# Patient Record
Sex: Male | Born: 1960 | Race: White | Hispanic: No | Marital: Married | Smoking: Former smoker
Health system: Southern US, Community
[De-identification: ages and names within clinical notes are randomized; demographics above are authoritative.]

## PROBLEM LIST (undated history)

## (undated) DIAGNOSIS — M109 Gout, unspecified: Secondary | ICD-10-CM

## (undated) DIAGNOSIS — I10 Essential (primary) hypertension: Secondary | ICD-10-CM

## (undated) DIAGNOSIS — F319 Bipolar disorder, unspecified: Secondary | ICD-10-CM

## (undated) DIAGNOSIS — E785 Hyperlipidemia, unspecified: Secondary | ICD-10-CM

## (undated) HISTORY — PX: NASAL SINUS SURGERY: SHX719

---

## 1998-04-19 ENCOUNTER — Observation Stay (HOSPITAL_COMMUNITY): Admission: RE | Admit: 1998-04-19 | Discharge: 1998-04-21 | Payer: Self-pay | Admitting: Family Medicine

## 1998-04-19 ENCOUNTER — Encounter: Payer: Self-pay | Admitting: Family Medicine

## 2002-02-27 ENCOUNTER — Ambulatory Visit (HOSPITAL_COMMUNITY): Admission: RE | Admit: 2002-02-27 | Discharge: 2002-02-27 | Payer: Self-pay | Admitting: *Deleted

## 2005-10-17 ENCOUNTER — Ambulatory Visit (HOSPITAL_BASED_OUTPATIENT_CLINIC_OR_DEPARTMENT_OTHER): Admission: RE | Admit: 2005-10-17 | Discharge: 2005-10-17 | Payer: Self-pay | Admitting: Otolaryngology

## 2005-10-22 ENCOUNTER — Ambulatory Visit: Payer: Self-pay | Admitting: Internal Medicine

## 2005-12-01 ENCOUNTER — Observation Stay (HOSPITAL_COMMUNITY): Admission: RE | Admit: 2005-12-01 | Discharge: 2005-12-02 | Payer: Self-pay | Admitting: Otolaryngology

## 2011-03-20 ENCOUNTER — Emergency Department (INDEPENDENT_AMBULATORY_CARE_PROVIDER_SITE_OTHER): Payer: BC Managed Care – PPO

## 2011-03-20 ENCOUNTER — Emergency Department (HOSPITAL_BASED_OUTPATIENT_CLINIC_OR_DEPARTMENT_OTHER)
Admission: EM | Admit: 2011-03-20 | Discharge: 2011-03-20 | Disposition: A | Discharge status: Home / Self Care | Payer: BC Managed Care – PPO | Attending: Emergency Medicine | Admitting: Emergency Medicine

## 2011-03-20 ENCOUNTER — Encounter: Payer: Self-pay | Admitting: *Deleted

## 2011-03-20 DIAGNOSIS — R51 Headache: Secondary | ICD-10-CM

## 2011-03-20 DIAGNOSIS — R4182 Altered mental status, unspecified: Secondary | ICD-10-CM

## 2011-03-20 DIAGNOSIS — R412 Retrograde amnesia: Secondary | ICD-10-CM

## 2011-03-20 DIAGNOSIS — R413 Other amnesia: Secondary | ICD-10-CM

## 2011-03-20 HISTORY — DX: Hyperlipidemia, unspecified: E78.5

## 2011-03-20 HISTORY — DX: Essential (primary) hypertension: I10

## 2011-03-20 HISTORY — DX: Bipolar disorder, unspecified: F31.9

## 2011-03-20 LAB — BASIC METABOLIC PANEL
CO2: 30 mEq/L (ref 19–32)
Calcium: 9.7 mg/dL (ref 8.4–10.5)
GFR calc Af Amer: >90 mL/min (ref >90)
GFR calc non Af Amer: >90 mL/min (ref >90)
Sodium: 140 mEq/L (ref 135–145)

## 2011-03-20 LAB — CBC
MCH: 31 pg (ref 26.0–34.0)
MCHC: 36.6 g/dL — ABNORMAL HIGH (ref 30.0–36.0)
Platelets: 176 10*3/uL (ref 150–400)
RBC: 5.13 MIL/uL (ref 4.22–5.81)

## 2011-03-20 LAB — TROPONIN I: Troponin I: <0.3 ng/mL (ref <0.30)

## 2011-03-20 NOTE — ED Notes (Signed)
Pt states syncopal episode yesterday, c/o dizzy , h/a.

## 2011-03-20 NOTE — ED Provider Notes (Signed)
History    Scribed for [name], MD, the patient was seen in room MH04/MH04. This chart was scribed by Katha Cabal. This patient's care was started at 7:21 PM.   CSN: [id number] Arrival date & time: 03/20/2011  6:31 PM  Chief Complaint  Patient presents with  . Near Syncope    (Consider location/radiation/quality/duration/timing/severity/associated sxs/prior treatment) HPI   Andrew Glover is a 50 y.o. male who presents to the Emergency Department complaining [place].  Per Wife:  Patient told her that he felt sick and eyes rolled back and passed out.  Patient doesn't remember the events.  Recalls prior to episode eyes rolled to right, headache and remembers leaving neighbors house.  Denies EtOH use.  Sx have resolved.  Patient was able to work today.  Denies chest pain.  Patient was told by PCP that he was a borderline diabetic.  [facility].  Dr. Fulton Mole       PAST MEDICAL HISTORY:  Past Medical History  Diagnosis Date  . Hypertension   . Hyperlipidemia   . Bipolar 1 disorder     PAST SURGICAL HISTORY:  History reviewed. No pertinent past surgical history.  FAMILY HISTORY:  History reviewed. No pertinent family history.   SOCIAL HISTORY: History   Social History  . Marital Status: Married    Spouse Name: N/A    Number of Children: N/A  . Years of Education: N/A   Social History Main Topics  . Smoking status: Never Smoker   . Smokeless tobacco: None  . Alcohol Use: No  . Drug Use: No  . Sexually Active: No   Other Topics Concern  . None   Social History Narrative  . None    Review of Systems  All other systems reviewed and are negative.    Allergies  Codeine  Home Medications   Current Outpatient Rx  Name Route Sig Dispense Refill  . ARIPIPRAZOLE 5 MG PO TABS Oral Take 2.5 mg by mouth daily.      . ATENOLOL-CHLORTHALIDONE 50-25 MG PO TABS Oral Take 1 tablet by mouth daily.      .  ATORVASTATIN CALCIUM 40 MG PO TABS Oral Take 40 mg by mouth daily.        BP 145/77  Pulse 65  Temp 98.8 F (37.1 C)  Resp 18  Ht 5\' 6"  (1.676 m)  Wt 178 lb (80.74 kg)  BMI 28.73 kg/m2  SpO2 97%  Physical Exam  Nursing note and vitals reviewed. Constitutional: He is oriented to person, place, and time. He appears well-developed and well-nourished. No distress.  HENT:  Head: Normocephalic and atraumatic.  Eyes: Conjunctivae and EOM are normal.  Neck: Normal range of motion.  Cardiovascular: Normal rate, regular rhythm, normal heart sounds and intact distal pulses.   Pulmonary/Chest: Effort normal and breath sounds normal. No respiratory distress. He has no wheezes. He has no rales.  Abdominal: Soft. He exhibits no distension. There is no tenderness. There is no rebound and no guarding.  Musculoskeletal: Normal range of motion. He exhibits no edema.  Neurological: He is alert and oriented to person, place, and time. Coordination normal.       Face symmetric.     Skin: Skin is warm and dry.  Psychiatric: He has a normal mood and affect. His speech is normal. Judgment normal.    ED Course  Procedures (including critical care time)   OTHER DATA REVIEWED: Nursing notes, vital signs,  and past medical records reviewed.   DIAGNOSTIC STUDIES: Oxygen Saturation is 97% on room air, normal by my interpretation.     LABS / RADIOLOGY:   Labs Reviewed  CBC - Abnormal; Notable for the following:    MCHC 36.6 (*)    All other components within normal limits  BASIC METABOLIC PANEL - Abnormal; Notable for the following:    Potassium 3.2 (*)    All other components within normal limits  TROPONIN I     Results for orders placed during the hospital encounter of 03/20/11  CBC      Component Value Range   WBC 9.0  4.0 - 10.5 (K/uL)   RBC 5.13  4.22 - 5.81 (MIL/uL)   Hemoglobin 15.9  13.0 - 17.0 (g/dL)   HCT 04.5  40.9 - 81.1 (%)   MCV 84.6  78.0 - 100.0 (fL)   MCH 31.0  26.0 -  34.0 (pg)   MCHC 36.6 (*) 30.0 - 36.0 (g/dL)   RDW 91.4  78.2 - 95.6 (%)   Platelets 176  150 - 400 (K/uL)  BASIC METABOLIC PANEL      Component Value Range   Sodium 140  135 - 145 (mEq/L)   Potassium 3.2 (*) 3.5 - 5.1 (mEq/L)   Chloride 101  96 - 112 (mEq/L)   CO2 30  19 - 32 (mEq/L)   Glucose, Bld 89  70 - 99 (mg/dL)   BUN 22  6 - 23 (mg/dL)   Creatinine, Ser 2.13  0.50 - 1.35 (mg/dL)   Calcium 9.7  8.4 - 08.6 (mg/dL)   GFR calc non Af Amer >90  >90 (mL/min)   GFR calc Af Amer >90  >90 (mL/min)  TROPONIN I      Component Value Range   Troponin I <0.30  <0.30 (ng/mL)     Ct Head Wo Contrast  03/20/2011  *RADIOLOGY REPORT*  Clinical Data: Headache.  Memory loss for 1 day.  CT HEAD WITHOUT CONTRAST  Technique:  Contiguous axial images were obtained from the base of the skull through the vertex without contrast.  Comparison: None.  Findings: No intracranial hemorrhage.  No CT evidence of large acute infarct.  Small acute infarct cannot be excluded by CT.  No intracranial mass lesion detected on this unenhanced exam.  No hydrocephalus.  Skull is intact.  Visualized sinuses and mastoid air cells are clear.  IMPRESSION: No acute abnormality.  Please see above.  Original Report Authenticated By: Fuller Canada, M.D.      ED COURSE / COORDINATION OF CARE:  7:33 PM  Physical exam complete.  Will order labs and review EKG.   8:24 PM  Radiological impression of CT Head:  No acute abnormality.   8:39 PM  Remaining labs returned. 9:16 PM  Plan to discharge patient.    Orders Placed This Encounter  Procedures  . CT Head Wo Contrast  . CBC  . Basic metabolic panel  . Troponin I  . EKG 12-Lead      Date: 03/20/2011  Rate: 70  Rhythm: normal sinus rhythm  QRS Axis: normal  Intervals: normal  ST/T Wave abnormalities: normal  Conduction Disutrbances:none  Narrative Interpretation:   Old EKG Reviewed: none available      MDM   MDM: Is unclear what the etiology of the  patient's complaints yesterday as well as his amnesia ar without complaint e associated with.  Today he has a normal neurologic exam he is without complaints.  My  suspicion for stroke is low.  My suspicion for TIA is low.  It does not sound like a seizure as the patient was able to drive home successfully.  His labs and head CT are normal.  Instructed to follow up closely with his physician.  Instructed to return to the ER for any new or worsening symptoms  IMPRESSION: Diagnoses that have been ruled out:  Diagnoses that are still under consideration:  Final diagnoses:  Amnesia (retrograde)     MEDICATIONS GIVEN IN THE E.D.     DISCHARGE MEDICATIONS: New Prescriptions   No medications on file     I personally performed the services described in this documentation, which was scribed in my presence. The recorded information has been reviewed and considered.            [name], MD 03/20/11 2135

## 2012-02-17 IMAGING — CT CT HEAD W/O CM
1 series · 16 of 30 positions shown, 20 images · non-contrast
Comparison: None.

CLINICAL DATA: Headache.  Memory loss for 1 day.

CT HEAD WITHOUT CONTRAST
TECHNIQUE: Contiguous axial images were obtained from the base of
the skull through the vertex without contrast.

[Series 2: head 4.8 h37s · axial · 0.45mm/px · z∈[+1151,+1284]mm · 16 of 32 slices shown, 20 images]
[im 2/32  brain]
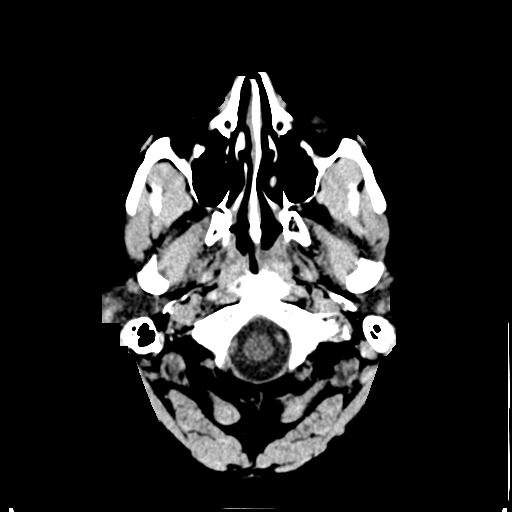
[im 2/32  bone]
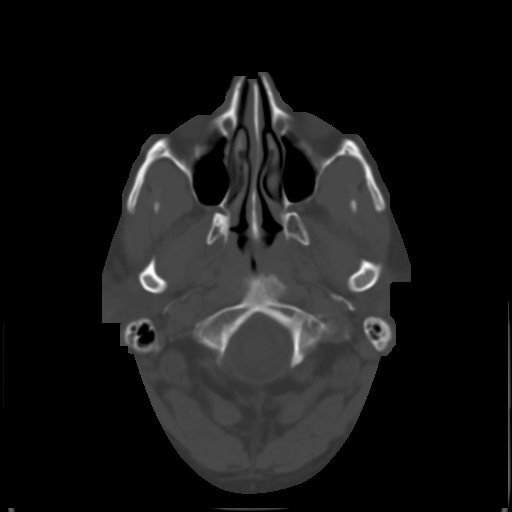
[im 4/32  brain]
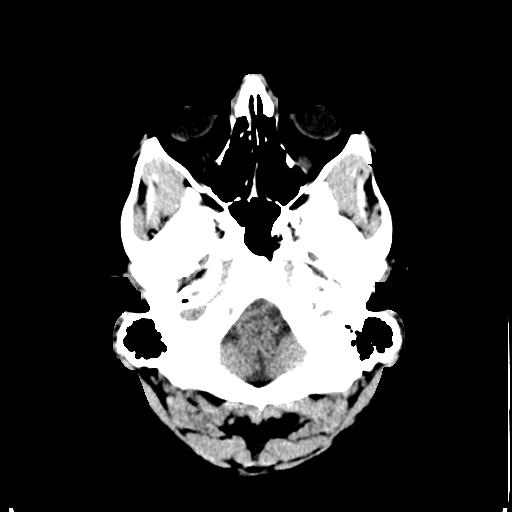
[im 6/32  brain]
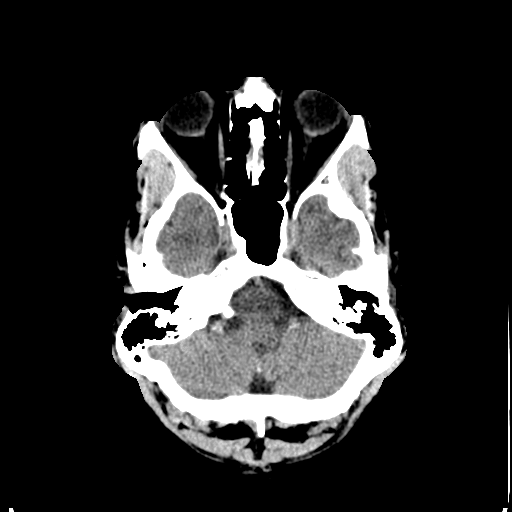
[im 8/32  brain]
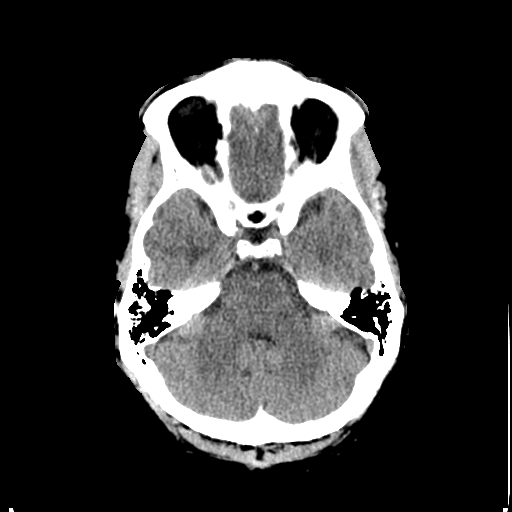
[im 9/32  brain]
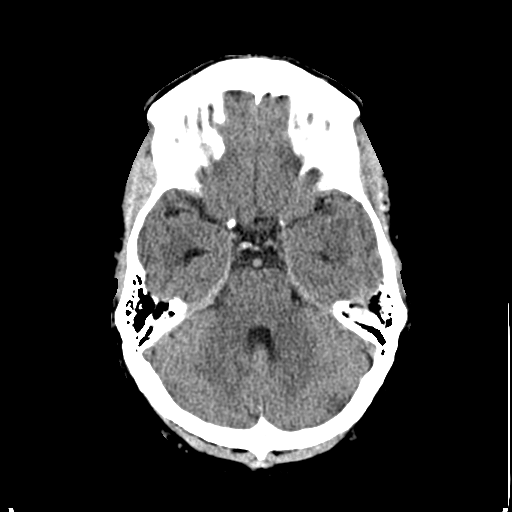
[im 9/32  bone]
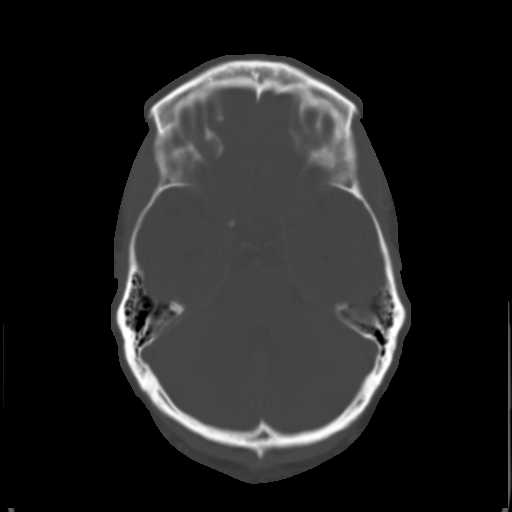
[im 11/32  brain]
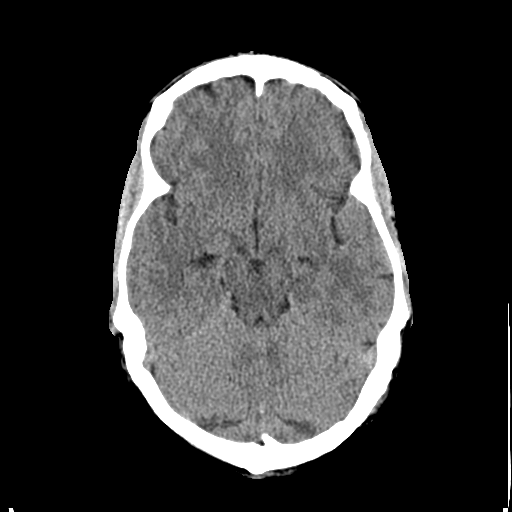
[im 13/32  brain]
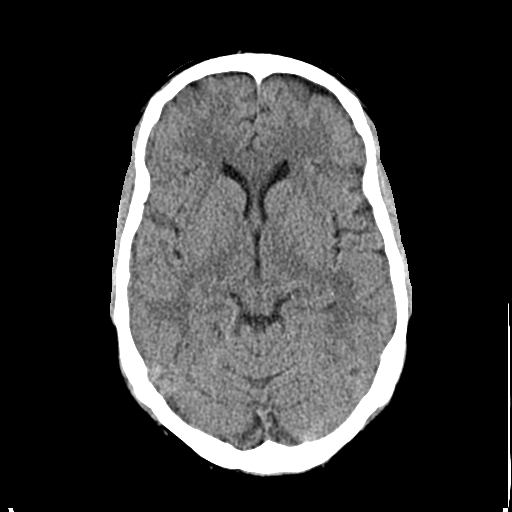
[im 15/32  brain]
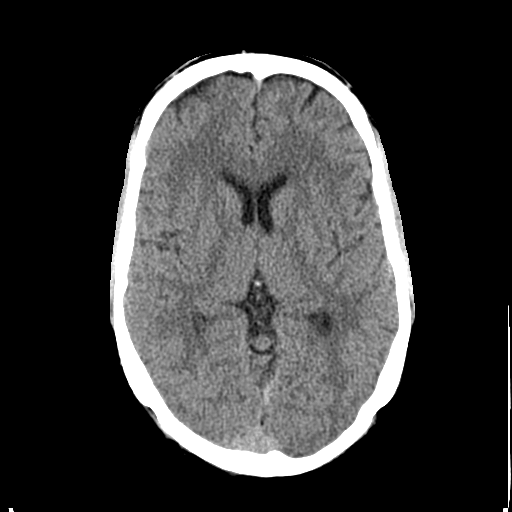
[im 17/32  brain]
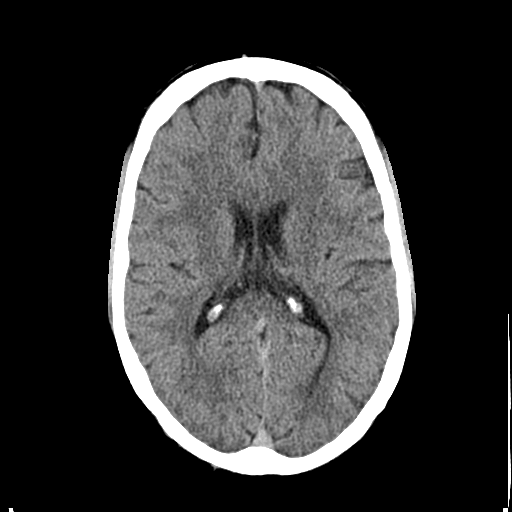
[im 17/32  bone]
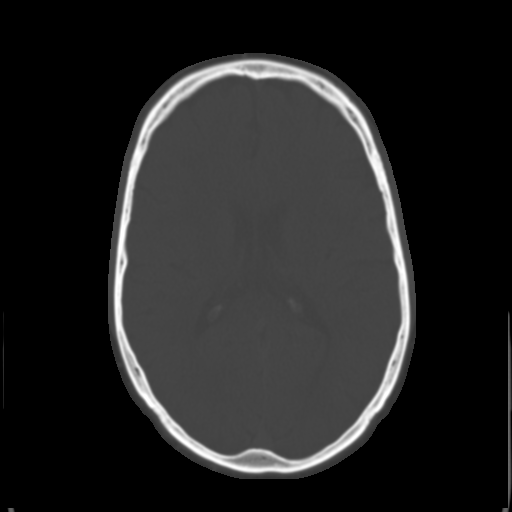
[im 19/32  brain]
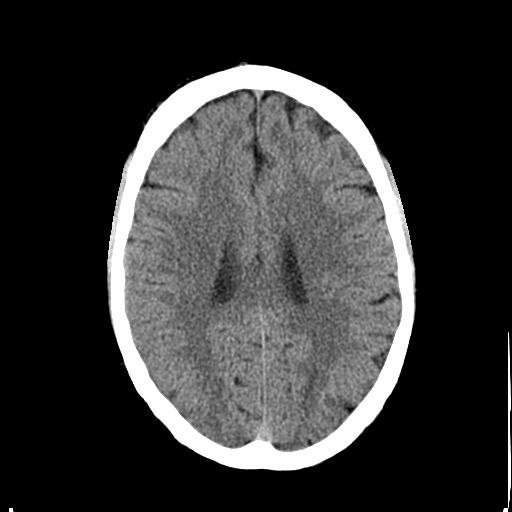
[im 21/32  brain]
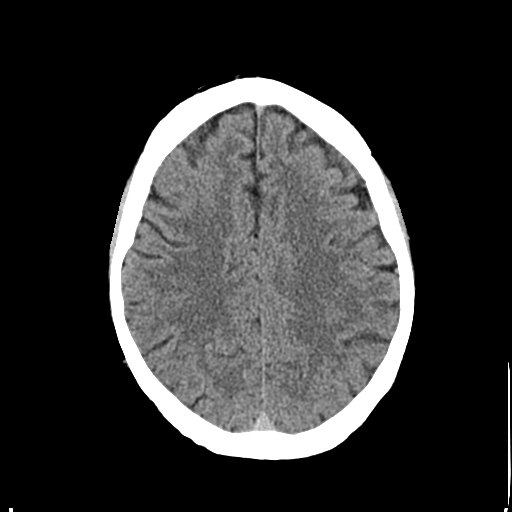
[im 23/32  brain]
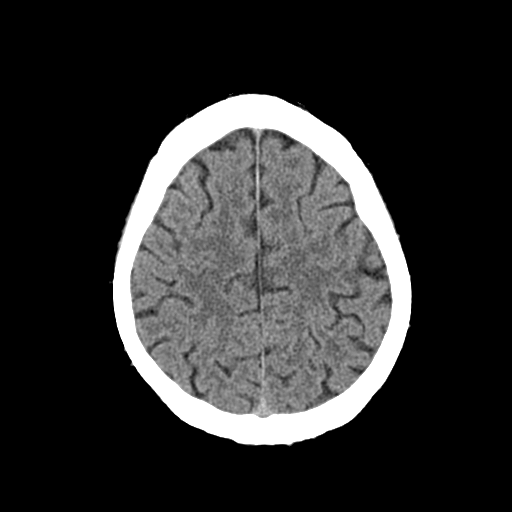
[im 24/32  brain]
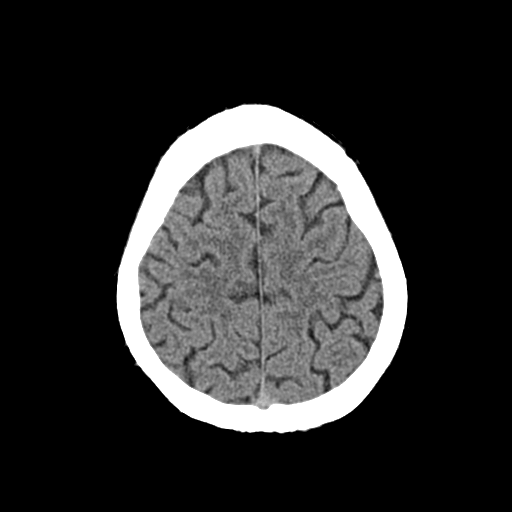
[im 24/32  bone]
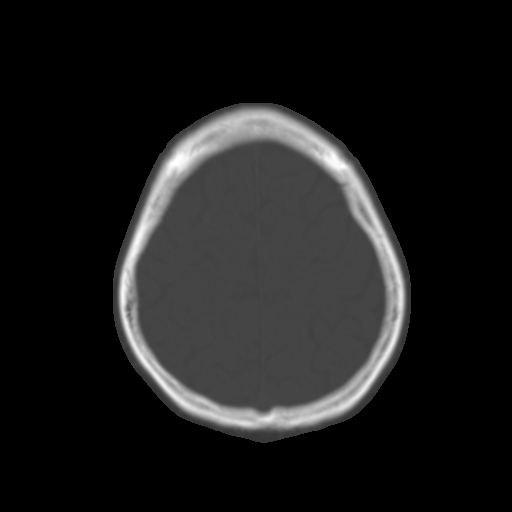
[im 26/32  brain]
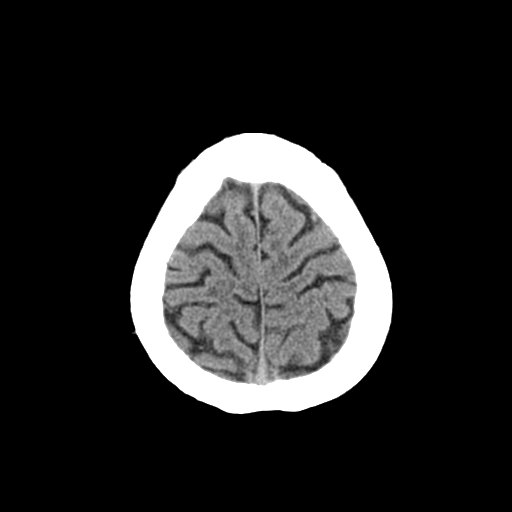
[im 28/32  brain]
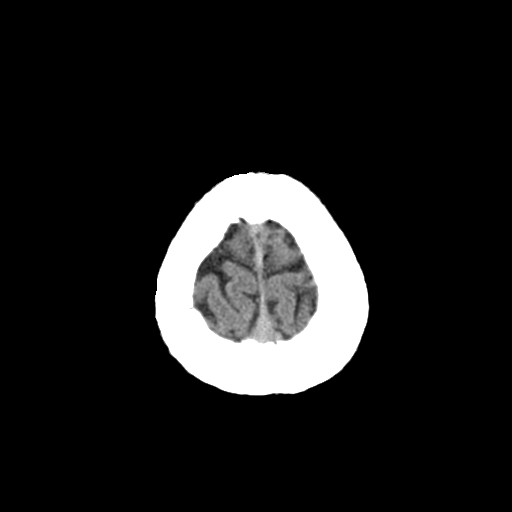
[im 30/32  brain]
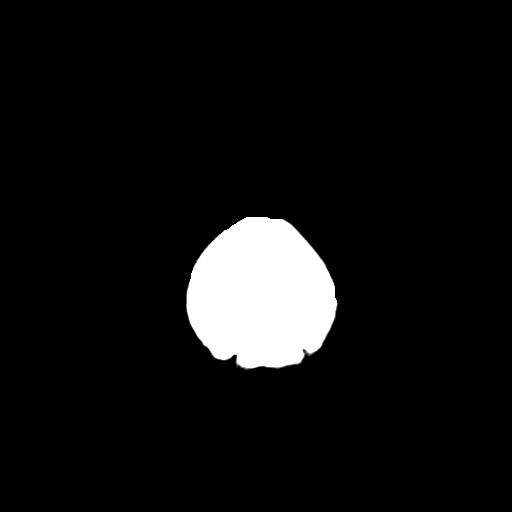

[16 of 30 positions shown; findings below may reference images not displayed]

FINDINGS: No intracranial hemorrhage.

No CT evidence of large acute infarct.  Small acute infarct cannot
be excluded by CT.

No intracranial mass lesion detected on this unenhanced exam.

No hydrocephalus.

Skull is intact.  Visualized sinuses and mastoid air cells are
clear.
IMPRESSION: No acute abnormality.  Please see above.

## 2015-09-29 DIAGNOSIS — Z8669 Personal history of other diseases of the nervous system and sense organs: Secondary | ICD-10-CM | POA: Diagnosis not present

## 2015-11-17 DIAGNOSIS — J069 Acute upper respiratory infection, unspecified: Secondary | ICD-10-CM | POA: Diagnosis not present

## 2016-01-25 DIAGNOSIS — L409 Psoriasis, unspecified: Secondary | ICD-10-CM | POA: Diagnosis not present

## 2016-01-25 DIAGNOSIS — E782 Mixed hyperlipidemia: Secondary | ICD-10-CM | POA: Diagnosis not present

## 2016-01-25 DIAGNOSIS — Z0000 Encounter for general adult medical examination without abnormal findings: Secondary | ICD-10-CM | POA: Diagnosis not present

## 2016-01-25 DIAGNOSIS — I10 Essential (primary) hypertension: Secondary | ICD-10-CM | POA: Diagnosis not present

## 2016-01-25 DIAGNOSIS — F3181 Bipolar II disorder: Secondary | ICD-10-CM | POA: Diagnosis not present

## 2016-01-25 DIAGNOSIS — Z125 Encounter for screening for malignant neoplasm of prostate: Secondary | ICD-10-CM | POA: Diagnosis not present

## 2016-07-13 DIAGNOSIS — F17200 Nicotine dependence, unspecified, uncomplicated: Secondary | ICD-10-CM | POA: Diagnosis not present

## 2016-07-13 DIAGNOSIS — F3181 Bipolar II disorder: Secondary | ICD-10-CM | POA: Diagnosis not present

## 2016-07-13 DIAGNOSIS — I10 Essential (primary) hypertension: Secondary | ICD-10-CM | POA: Diagnosis not present

## 2016-07-25 DIAGNOSIS — H2513 Age-related nuclear cataract, bilateral: Secondary | ICD-10-CM | POA: Diagnosis not present

## 2016-07-25 DIAGNOSIS — H40052 Ocular hypertension, left eye: Secondary | ICD-10-CM | POA: Diagnosis not present

## 2016-07-25 DIAGNOSIS — H25013 Cortical age-related cataract, bilateral: Secondary | ICD-10-CM | POA: Diagnosis not present

## 2016-07-25 DIAGNOSIS — H524 Presbyopia: Secondary | ICD-10-CM | POA: Diagnosis not present

## 2017-02-06 DIAGNOSIS — E782 Mixed hyperlipidemia: Secondary | ICD-10-CM | POA: Diagnosis not present

## 2017-02-06 DIAGNOSIS — Z1159 Encounter for screening for other viral diseases: Secondary | ICD-10-CM | POA: Diagnosis not present

## 2017-02-06 DIAGNOSIS — F3181 Bipolar II disorder: Secondary | ICD-10-CM | POA: Diagnosis not present

## 2017-02-06 DIAGNOSIS — L409 Psoriasis, unspecified: Secondary | ICD-10-CM | POA: Diagnosis not present

## 2017-02-06 DIAGNOSIS — I10 Essential (primary) hypertension: Secondary | ICD-10-CM | POA: Diagnosis not present

## 2017-02-06 DIAGNOSIS — Z0000 Encounter for general adult medical examination without abnormal findings: Secondary | ICD-10-CM | POA: Diagnosis not present

## 2017-08-16 DIAGNOSIS — R7303 Prediabetes: Secondary | ICD-10-CM | POA: Diagnosis not present

## 2017-08-16 DIAGNOSIS — F3181 Bipolar II disorder: Secondary | ICD-10-CM | POA: Diagnosis not present

## 2017-08-16 DIAGNOSIS — L409 Psoriasis, unspecified: Secondary | ICD-10-CM | POA: Diagnosis not present

## 2017-08-16 DIAGNOSIS — I10 Essential (primary) hypertension: Secondary | ICD-10-CM | POA: Diagnosis not present

## 2017-08-16 DIAGNOSIS — E782 Mixed hyperlipidemia: Secondary | ICD-10-CM | POA: Diagnosis not present

## 2017-11-20 DIAGNOSIS — H25013 Cortical age-related cataract, bilateral: Secondary | ICD-10-CM | POA: Diagnosis not present

## 2017-11-20 DIAGNOSIS — H2513 Age-related nuclear cataract, bilateral: Secondary | ICD-10-CM | POA: Diagnosis not present

## 2017-11-20 DIAGNOSIS — H524 Presbyopia: Secondary | ICD-10-CM | POA: Diagnosis not present

## 2018-02-19 DIAGNOSIS — E782 Mixed hyperlipidemia: Secondary | ICD-10-CM | POA: Diagnosis not present

## 2018-02-19 DIAGNOSIS — F3181 Bipolar II disorder: Secondary | ICD-10-CM | POA: Diagnosis not present

## 2018-02-19 DIAGNOSIS — I10 Essential (primary) hypertension: Secondary | ICD-10-CM | POA: Diagnosis not present

## 2018-02-19 DIAGNOSIS — R7303 Prediabetes: Secondary | ICD-10-CM | POA: Diagnosis not present

## 2018-02-19 DIAGNOSIS — L409 Psoriasis, unspecified: Secondary | ICD-10-CM | POA: Diagnosis not present

## 2018-03-07 DIAGNOSIS — E782 Mixed hyperlipidemia: Secondary | ICD-10-CM | POA: Diagnosis not present

## 2018-03-07 DIAGNOSIS — M2550 Pain in unspecified joint: Secondary | ICD-10-CM | POA: Diagnosis not present

## 2018-03-07 DIAGNOSIS — Z125 Encounter for screening for malignant neoplasm of prostate: Secondary | ICD-10-CM | POA: Diagnosis not present

## 2018-03-07 DIAGNOSIS — I10 Essential (primary) hypertension: Secondary | ICD-10-CM | POA: Diagnosis not present

## 2018-04-22 DIAGNOSIS — R0683 Snoring: Secondary | ICD-10-CM | POA: Diagnosis not present

## 2018-04-22 DIAGNOSIS — R0681 Apnea, not elsewhere classified: Secondary | ICD-10-CM | POA: Diagnosis not present

## 2018-05-20 DIAGNOSIS — G4733 Obstructive sleep apnea (adult) (pediatric): Secondary | ICD-10-CM | POA: Diagnosis not present

## 2018-06-04 DIAGNOSIS — G4733 Obstructive sleep apnea (adult) (pediatric): Secondary | ICD-10-CM | POA: Diagnosis not present

## 2018-07-05 DIAGNOSIS — G4733 Obstructive sleep apnea (adult) (pediatric): Secondary | ICD-10-CM | POA: Diagnosis not present

## 2018-07-26 DIAGNOSIS — G4733 Obstructive sleep apnea (adult) (pediatric): Secondary | ICD-10-CM | POA: Diagnosis not present

## 2018-08-05 DIAGNOSIS — G4733 Obstructive sleep apnea (adult) (pediatric): Secondary | ICD-10-CM | POA: Diagnosis not present

## 2018-08-19 DIAGNOSIS — R7303 Prediabetes: Secondary | ICD-10-CM | POA: Diagnosis not present

## 2018-08-19 DIAGNOSIS — G4733 Obstructive sleep apnea (adult) (pediatric): Secondary | ICD-10-CM | POA: Diagnosis not present

## 2018-08-19 DIAGNOSIS — E782 Mixed hyperlipidemia: Secondary | ICD-10-CM | POA: Diagnosis not present

## 2018-08-19 DIAGNOSIS — K219 Gastro-esophageal reflux disease without esophagitis: Secondary | ICD-10-CM | POA: Diagnosis not present

## 2018-08-19 DIAGNOSIS — I10 Essential (primary) hypertension: Secondary | ICD-10-CM | POA: Diagnosis not present

## 2018-09-03 DIAGNOSIS — G4733 Obstructive sleep apnea (adult) (pediatric): Secondary | ICD-10-CM | POA: Diagnosis not present

## 2018-10-04 DIAGNOSIS — G4733 Obstructive sleep apnea (adult) (pediatric): Secondary | ICD-10-CM | POA: Diagnosis not present

## 2018-11-03 DIAGNOSIS — G4733 Obstructive sleep apnea (adult) (pediatric): Secondary | ICD-10-CM | POA: Diagnosis not present

## 2018-11-26 DIAGNOSIS — H524 Presbyopia: Secondary | ICD-10-CM | POA: Diagnosis not present

## 2018-11-26 DIAGNOSIS — H2513 Age-related nuclear cataract, bilateral: Secondary | ICD-10-CM | POA: Diagnosis not present

## 2018-11-26 DIAGNOSIS — H25013 Cortical age-related cataract, bilateral: Secondary | ICD-10-CM | POA: Diagnosis not present

## 2018-12-04 DIAGNOSIS — G4733 Obstructive sleep apnea (adult) (pediatric): Secondary | ICD-10-CM | POA: Diagnosis not present

## 2019-01-03 DIAGNOSIS — G4733 Obstructive sleep apnea (adult) (pediatric): Secondary | ICD-10-CM | POA: Diagnosis not present

## 2019-01-23 DIAGNOSIS — R0902 Hypoxemia: Secondary | ICD-10-CM | POA: Diagnosis not present

## 2019-02-03 DIAGNOSIS — G4733 Obstructive sleep apnea (adult) (pediatric): Secondary | ICD-10-CM | POA: Diagnosis not present

## 2019-02-14 DIAGNOSIS — R112 Nausea with vomiting, unspecified: Secondary | ICD-10-CM | POA: Diagnosis not present

## 2019-02-14 DIAGNOSIS — R197 Diarrhea, unspecified: Secondary | ICD-10-CM | POA: Diagnosis not present

## 2019-02-14 DIAGNOSIS — Z20828 Contact with and (suspected) exposure to other viral communicable diseases: Secondary | ICD-10-CM | POA: Diagnosis not present

## 2019-03-04 DIAGNOSIS — M109 Gout, unspecified: Secondary | ICD-10-CM | POA: Diagnosis not present

## 2019-03-06 DIAGNOSIS — G4733 Obstructive sleep apnea (adult) (pediatric): Secondary | ICD-10-CM | POA: Diagnosis not present

## 2019-03-13 DIAGNOSIS — R7303 Prediabetes: Secondary | ICD-10-CM | POA: Diagnosis not present

## 2019-03-13 DIAGNOSIS — K219 Gastro-esophageal reflux disease without esophagitis: Secondary | ICD-10-CM | POA: Diagnosis not present

## 2019-03-13 DIAGNOSIS — Z0000 Encounter for general adult medical examination without abnormal findings: Secondary | ICD-10-CM | POA: Diagnosis not present

## 2019-03-13 DIAGNOSIS — E782 Mixed hyperlipidemia: Secondary | ICD-10-CM | POA: Diagnosis not present

## 2019-03-13 DIAGNOSIS — I10 Essential (primary) hypertension: Secondary | ICD-10-CM | POA: Diagnosis not present

## 2019-03-14 DIAGNOSIS — Z0000 Encounter for general adult medical examination without abnormal findings: Secondary | ICD-10-CM | POA: Diagnosis not present

## 2019-03-14 DIAGNOSIS — I10 Essential (primary) hypertension: Secondary | ICD-10-CM | POA: Diagnosis not present

## 2019-03-14 DIAGNOSIS — E782 Mixed hyperlipidemia: Secondary | ICD-10-CM | POA: Diagnosis not present

## 2019-04-01 DIAGNOSIS — D72829 Elevated white blood cell count, unspecified: Secondary | ICD-10-CM | POA: Diagnosis not present

## 2019-04-22 DIAGNOSIS — F3181 Bipolar II disorder: Secondary | ICD-10-CM | POA: Diagnosis not present

## 2019-04-23 DIAGNOSIS — R7303 Prediabetes: Secondary | ICD-10-CM | POA: Diagnosis not present

## 2019-05-05 DIAGNOSIS — M545 Low back pain: Secondary | ICD-10-CM | POA: Diagnosis not present

## 2019-05-27 DIAGNOSIS — Z01812 Encounter for preprocedural laboratory examination: Secondary | ICD-10-CM | POA: Diagnosis not present

## 2019-05-30 DIAGNOSIS — K633 Ulcer of intestine: Secondary | ICD-10-CM | POA: Diagnosis not present

## 2019-05-30 DIAGNOSIS — K5730 Diverticulosis of large intestine without perforation or abscess without bleeding: Secondary | ICD-10-CM | POA: Diagnosis not present

## 2019-05-30 DIAGNOSIS — Z86010 Personal history of colonic polyps: Secondary | ICD-10-CM | POA: Diagnosis not present

## 2019-05-30 DIAGNOSIS — K5289 Other specified noninfective gastroenteritis and colitis: Secondary | ICD-10-CM | POA: Diagnosis not present

## 2019-05-30 LAB — HM COLONOSCOPY

## 2019-06-10 DIAGNOSIS — F3181 Bipolar II disorder: Secondary | ICD-10-CM | POA: Diagnosis not present

## 2019-07-01 DIAGNOSIS — G4733 Obstructive sleep apnea (adult) (pediatric): Secondary | ICD-10-CM | POA: Diagnosis not present

## 2019-08-15 DIAGNOSIS — Z6833 Body mass index (BMI) 33.0-33.9, adult: Secondary | ICD-10-CM | POA: Diagnosis not present

## 2019-08-15 DIAGNOSIS — E7800 Pure hypercholesterolemia, unspecified: Secondary | ICD-10-CM | POA: Diagnosis not present

## 2019-08-15 DIAGNOSIS — I10 Essential (primary) hypertension: Secondary | ICD-10-CM | POA: Diagnosis not present

## 2019-08-15 DIAGNOSIS — E1169 Type 2 diabetes mellitus with other specified complication: Secondary | ICD-10-CM | POA: Diagnosis not present

## 2019-08-25 DIAGNOSIS — G4733 Obstructive sleep apnea (adult) (pediatric): Secondary | ICD-10-CM | POA: Diagnosis not present

## 2019-09-18 DIAGNOSIS — K219 Gastro-esophageal reflux disease without esophagitis: Secondary | ICD-10-CM | POA: Diagnosis not present

## 2019-09-18 DIAGNOSIS — I10 Essential (primary) hypertension: Secondary | ICD-10-CM | POA: Diagnosis not present

## 2019-09-18 DIAGNOSIS — E1169 Type 2 diabetes mellitus with other specified complication: Secondary | ICD-10-CM | POA: Diagnosis not present

## 2019-09-18 DIAGNOSIS — Z0000 Encounter for general adult medical examination without abnormal findings: Secondary | ICD-10-CM | POA: Diagnosis not present

## 2019-09-18 DIAGNOSIS — E782 Mixed hyperlipidemia: Secondary | ICD-10-CM | POA: Diagnosis not present

## 2019-09-24 DIAGNOSIS — E1169 Type 2 diabetes mellitus with other specified complication: Secondary | ICD-10-CM | POA: Diagnosis not present

## 2019-09-24 DIAGNOSIS — M109 Gout, unspecified: Secondary | ICD-10-CM | POA: Diagnosis not present

## 2019-09-24 DIAGNOSIS — I10 Essential (primary) hypertension: Secondary | ICD-10-CM | POA: Diagnosis not present

## 2019-09-24 DIAGNOSIS — E782 Mixed hyperlipidemia: Secondary | ICD-10-CM | POA: Diagnosis not present

## 2019-09-29 DIAGNOSIS — G4733 Obstructive sleep apnea (adult) (pediatric): Secondary | ICD-10-CM | POA: Diagnosis not present

## 2019-10-10 DIAGNOSIS — F1020 Alcohol dependence, uncomplicated: Secondary | ICD-10-CM | POA: Diagnosis not present

## 2019-10-10 DIAGNOSIS — F3181 Bipolar II disorder: Secondary | ICD-10-CM | POA: Diagnosis not present

## 2019-10-10 DIAGNOSIS — I10 Essential (primary) hypertension: Secondary | ICD-10-CM | POA: Diagnosis not present

## 2019-11-21 DIAGNOSIS — H2000 Unspecified acute and subacute iridocyclitis: Secondary | ICD-10-CM | POA: Diagnosis not present

## 2019-11-27 DIAGNOSIS — H2000 Unspecified acute and subacute iridocyclitis: Secondary | ICD-10-CM | POA: Diagnosis not present

## 2019-11-27 DIAGNOSIS — H21541 Posterior synechiae (iris), right eye: Secondary | ICD-10-CM | POA: Diagnosis not present

## 2019-12-11 DIAGNOSIS — R109 Unspecified abdominal pain: Secondary | ICD-10-CM | POA: Diagnosis not present

## 2019-12-12 ENCOUNTER — Other Ambulatory Visit (HOSPITAL_BASED_OUTPATIENT_CLINIC_OR_DEPARTMENT_OTHER): Payer: Self-pay | Admitting: Family Medicine

## 2019-12-12 DIAGNOSIS — R1084 Generalized abdominal pain: Secondary | ICD-10-CM

## 2019-12-16 ENCOUNTER — Ambulatory Visit (HOSPITAL_BASED_OUTPATIENT_CLINIC_OR_DEPARTMENT_OTHER)
Admission: RE | Admit: 2019-12-16 | Discharge: 2019-12-16 | Disposition: A | Discharge status: Home / Self Care | Payer: BC Managed Care – PPO | Source: Ambulatory Visit | Attending: Family Medicine | Admitting: Family Medicine

## 2019-12-16 ENCOUNTER — Other Ambulatory Visit: Payer: Self-pay

## 2019-12-16 DIAGNOSIS — K760 Fatty (change of) liver, not elsewhere classified: Secondary | ICD-10-CM | POA: Diagnosis not present

## 2019-12-16 DIAGNOSIS — F3181 Bipolar II disorder: Secondary | ICD-10-CM | POA: Diagnosis not present

## 2019-12-16 DIAGNOSIS — R1084 Generalized abdominal pain: Secondary | ICD-10-CM | POA: Diagnosis not present

## 2019-12-16 DIAGNOSIS — K8020 Calculus of gallbladder without cholecystitis without obstruction: Secondary | ICD-10-CM | POA: Diagnosis not present

## 2019-12-19 DIAGNOSIS — H21541 Posterior synechiae (iris), right eye: Secondary | ICD-10-CM | POA: Diagnosis not present

## 2019-12-19 DIAGNOSIS — H2000 Unspecified acute and subacute iridocyclitis: Secondary | ICD-10-CM | POA: Diagnosis not present

## 2019-12-19 DIAGNOSIS — Z20822 Contact with and (suspected) exposure to covid-19: Secondary | ICD-10-CM | POA: Diagnosis not present

## 2019-12-25 DIAGNOSIS — E860 Dehydration: Secondary | ICD-10-CM | POA: Diagnosis not present

## 2019-12-29 DIAGNOSIS — G4733 Obstructive sleep apnea (adult) (pediatric): Secondary | ICD-10-CM | POA: Diagnosis not present

## 2020-02-05 DIAGNOSIS — Z20822 Contact with and (suspected) exposure to covid-19: Secondary | ICD-10-CM | POA: Diagnosis not present

## 2020-02-12 DIAGNOSIS — F1020 Alcohol dependence, uncomplicated: Secondary | ICD-10-CM | POA: Diagnosis not present

## 2020-02-12 DIAGNOSIS — I10 Essential (primary) hypertension: Secondary | ICD-10-CM | POA: Diagnosis not present

## 2020-02-12 DIAGNOSIS — F3181 Bipolar II disorder: Secondary | ICD-10-CM | POA: Diagnosis not present

## 2020-03-16 DIAGNOSIS — K219 Gastro-esophageal reflux disease without esophagitis: Secondary | ICD-10-CM | POA: Diagnosis not present

## 2020-03-16 DIAGNOSIS — H40053 Ocular hypertension, bilateral: Secondary | ICD-10-CM | POA: Diagnosis not present

## 2020-03-16 DIAGNOSIS — E1169 Type 2 diabetes mellitus with other specified complication: Secondary | ICD-10-CM | POA: Diagnosis not present

## 2020-03-16 DIAGNOSIS — I10 Essential (primary) hypertension: Secondary | ICD-10-CM | POA: Diagnosis not present

## 2020-03-16 DIAGNOSIS — E782 Mixed hyperlipidemia: Secondary | ICD-10-CM | POA: Diagnosis not present

## 2020-03-16 DIAGNOSIS — Z125 Encounter for screening for malignant neoplasm of prostate: Secondary | ICD-10-CM | POA: Diagnosis not present

## 2020-03-16 DIAGNOSIS — M109 Gout, unspecified: Secondary | ICD-10-CM | POA: Diagnosis not present

## 2020-03-16 DIAGNOSIS — H40013 Open angle with borderline findings, low risk, bilateral: Secondary | ICD-10-CM | POA: Diagnosis not present

## 2020-03-16 DIAGNOSIS — H2000 Unspecified acute and subacute iridocyclitis: Secondary | ICD-10-CM | POA: Diagnosis not present

## 2020-03-29 DIAGNOSIS — G4733 Obstructive sleep apnea (adult) (pediatric): Secondary | ICD-10-CM | POA: Diagnosis not present

## 2020-04-15 DIAGNOSIS — H40013 Open angle with borderline findings, low risk, bilateral: Secondary | ICD-10-CM | POA: Diagnosis not present

## 2020-04-15 DIAGNOSIS — H40053 Ocular hypertension, bilateral: Secondary | ICD-10-CM | POA: Diagnosis not present

## 2020-04-15 DIAGNOSIS — H2000 Unspecified acute and subacute iridocyclitis: Secondary | ICD-10-CM | POA: Diagnosis not present

## 2020-06-29 DIAGNOSIS — G4733 Obstructive sleep apnea (adult) (pediatric): Secondary | ICD-10-CM | POA: Diagnosis not present

## 2020-07-30 DIAGNOSIS — E1139 Type 2 diabetes mellitus with other diabetic ophthalmic complication: Secondary | ICD-10-CM | POA: Diagnosis not present

## 2020-07-30 DIAGNOSIS — L750 Bromhidrosis: Secondary | ICD-10-CM | POA: Diagnosis not present

## 2020-07-30 DIAGNOSIS — I10 Essential (primary) hypertension: Secondary | ICD-10-CM | POA: Diagnosis not present

## 2020-07-30 DIAGNOSIS — F1010 Alcohol abuse, uncomplicated: Secondary | ICD-10-CM | POA: Diagnosis not present

## 2020-08-30 DIAGNOSIS — G4733 Obstructive sleep apnea (adult) (pediatric): Secondary | ICD-10-CM | POA: Diagnosis not present

## 2020-09-16 DIAGNOSIS — E782 Mixed hyperlipidemia: Secondary | ICD-10-CM | POA: Diagnosis not present

## 2020-09-16 DIAGNOSIS — F1020 Alcohol dependence, uncomplicated: Secondary | ICD-10-CM | POA: Diagnosis not present

## 2020-09-16 DIAGNOSIS — I10 Essential (primary) hypertension: Secondary | ICD-10-CM | POA: Diagnosis not present

## 2020-09-16 DIAGNOSIS — E1169 Type 2 diabetes mellitus with other specified complication: Secondary | ICD-10-CM | POA: Diagnosis not present

## 2020-09-16 DIAGNOSIS — K219 Gastro-esophageal reflux disease without esophagitis: Secondary | ICD-10-CM | POA: Diagnosis not present

## 2020-09-27 DIAGNOSIS — G4733 Obstructive sleep apnea (adult) (pediatric): Secondary | ICD-10-CM | POA: Diagnosis not present

## 2020-10-06 DIAGNOSIS — F1290 Cannabis use, unspecified, uncomplicated: Secondary | ICD-10-CM | POA: Diagnosis not present

## 2020-10-06 DIAGNOSIS — Z8616 Personal history of COVID-19: Secondary | ICD-10-CM | POA: Diagnosis not present

## 2020-10-06 DIAGNOSIS — R438 Other disturbances of smell and taste: Secondary | ICD-10-CM | POA: Diagnosis not present

## 2020-10-22 DIAGNOSIS — H25013 Cortical age-related cataract, bilateral: Secondary | ICD-10-CM | POA: Diagnosis not present

## 2020-10-22 DIAGNOSIS — H2513 Age-related nuclear cataract, bilateral: Secondary | ICD-10-CM | POA: Diagnosis not present

## 2020-10-22 DIAGNOSIS — H524 Presbyopia: Secondary | ICD-10-CM | POA: Diagnosis not present

## 2020-10-22 DIAGNOSIS — H40013 Open angle with borderline findings, low risk, bilateral: Secondary | ICD-10-CM | POA: Diagnosis not present

## 2020-11-14 IMAGING — US US ABDOMEN LIMITED
1 series · 14 of 25 positions shown · non-contrast
Comparison: None.

CLINICAL DATA: 65-year-old male with abdominal pain. Evaluate for
gallstone.

EXAM:
ULTRASOUND ABDOMEN LIMITED RIGHT UPPER QUADRANT

[Series 1: us abdomen limited · 14 of 75 slices shown]
[im 1/75]
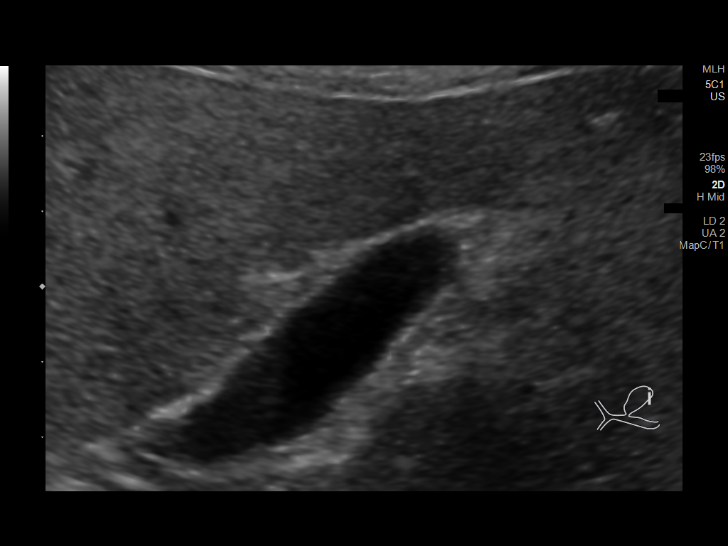
[im 7/75]
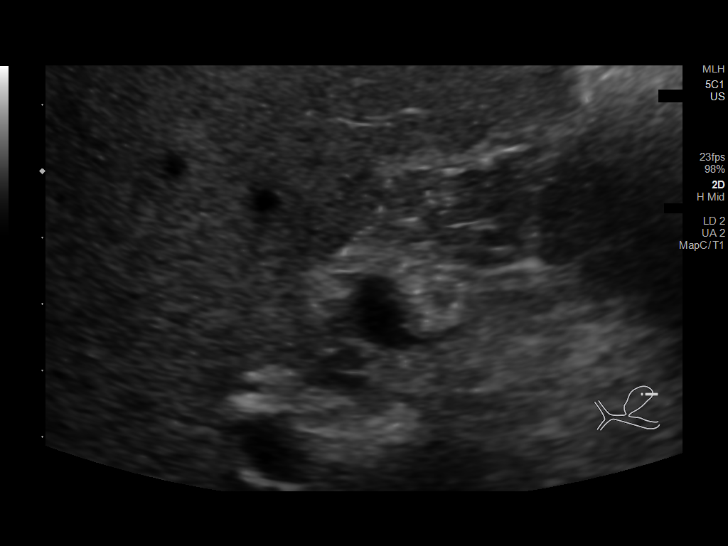
[im 13/75]
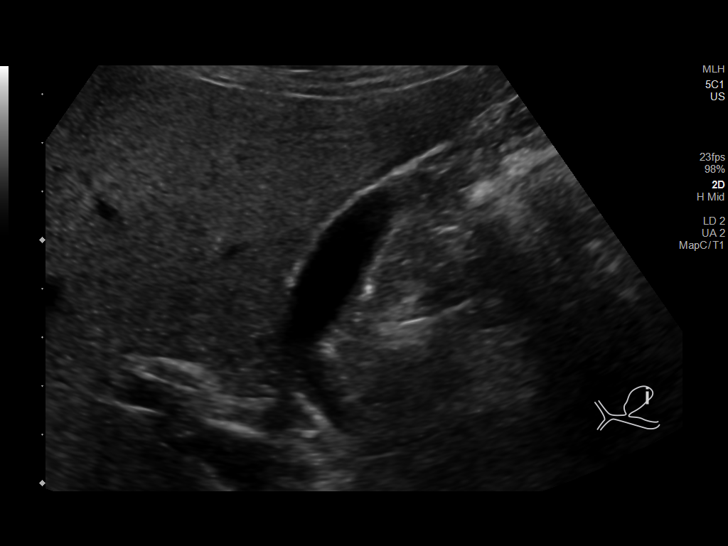
[im 19/75]
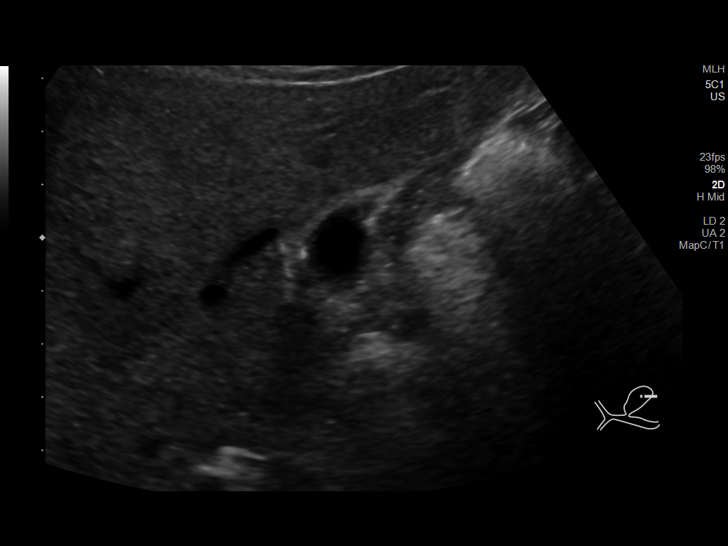
[im 25/75]
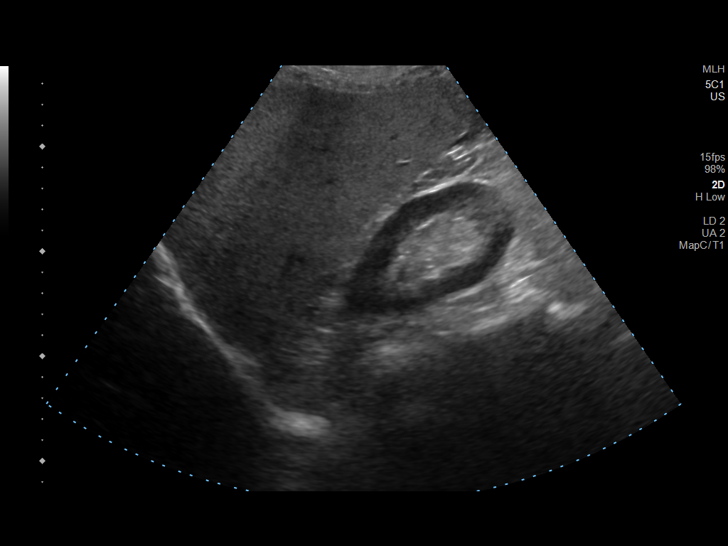
[im 28/75]
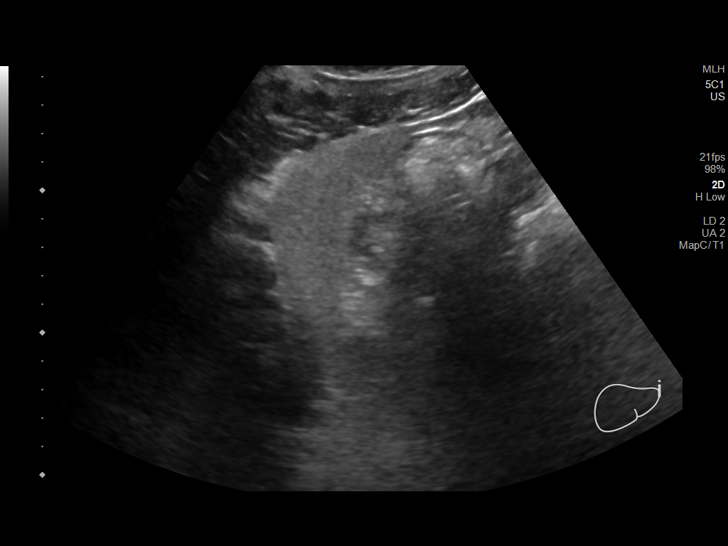
[im 34/75]
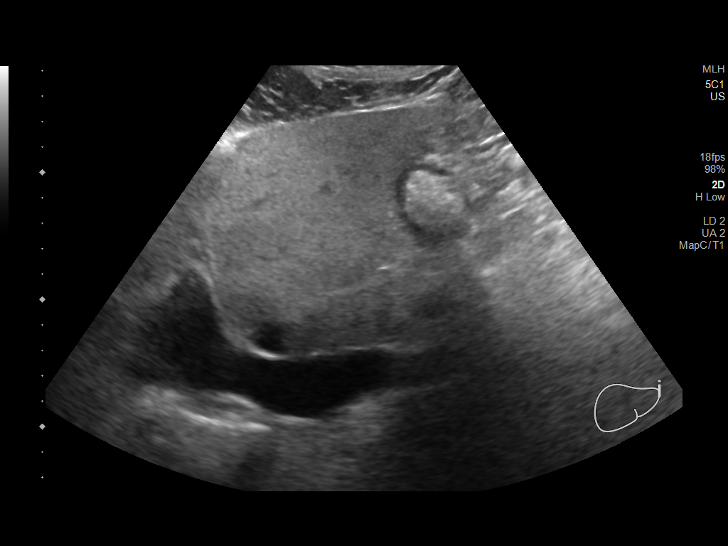
[im 41/75]
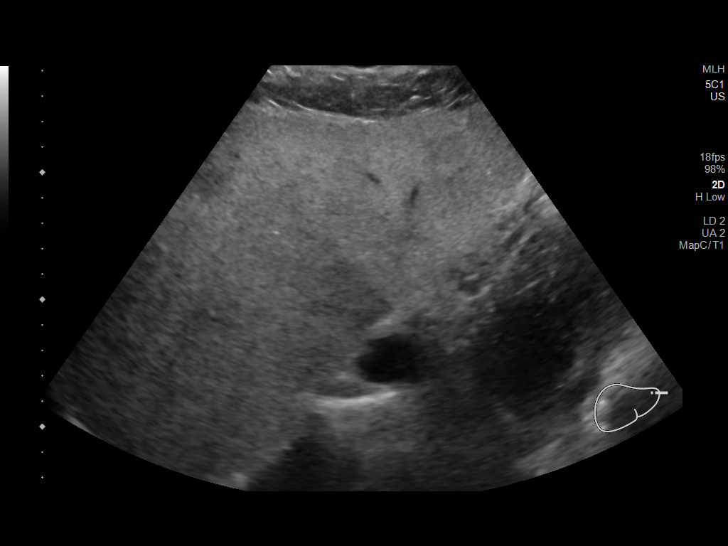
[im 47/75]
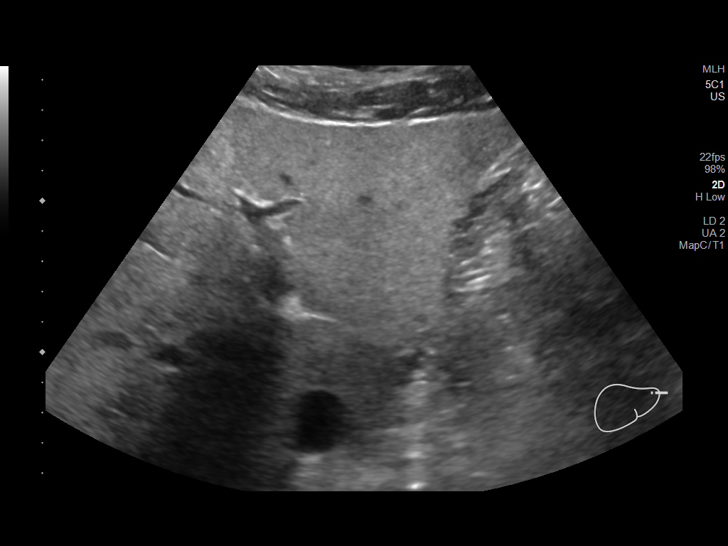
[im 50/75]
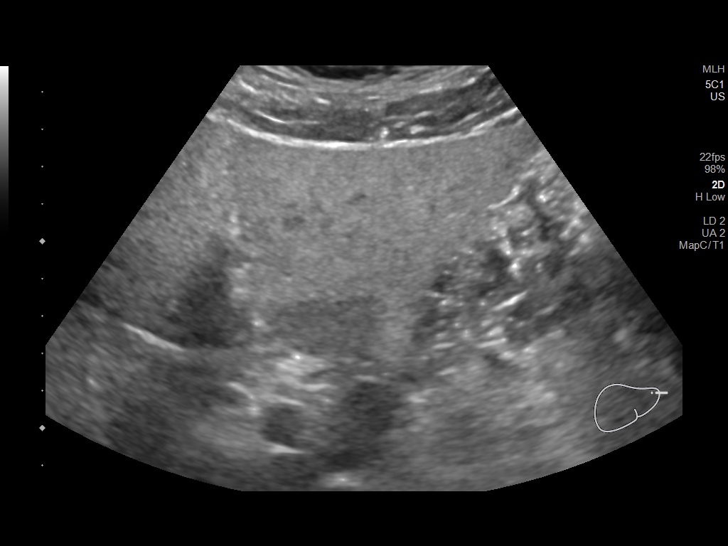
[im 56/75]
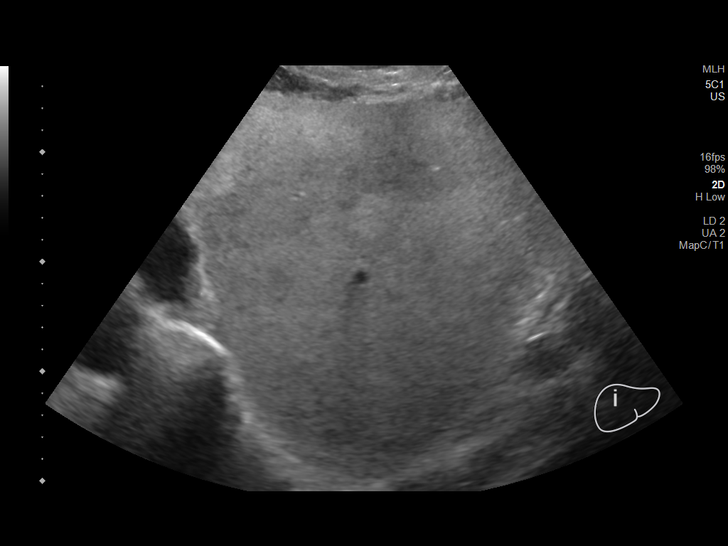
[im 62/75]
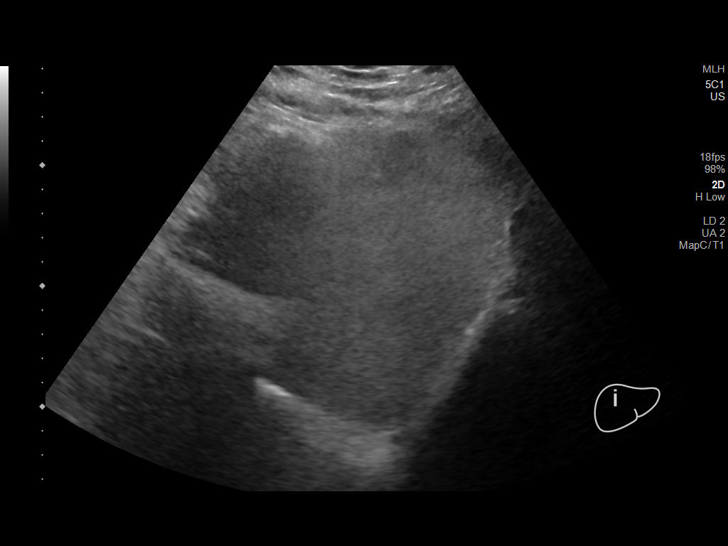
[im 68/75]
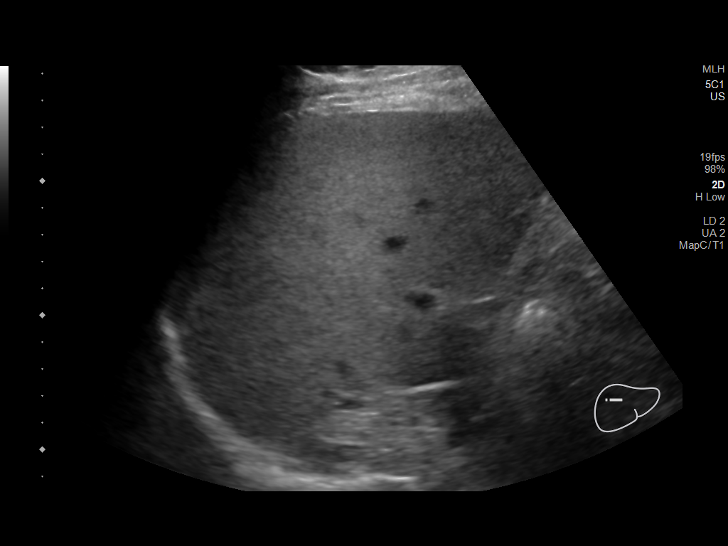
[im 75/75]
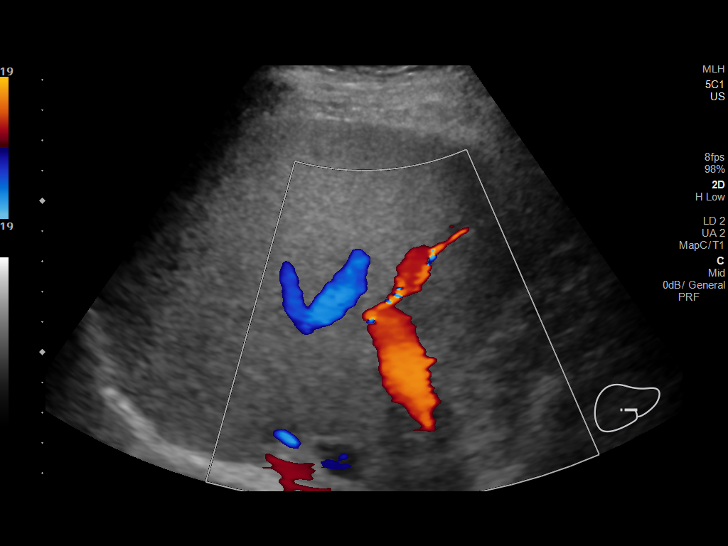

[14 of 25 positions shown; findings below may reference images not displayed]

FINDINGS: Gallbladder:

No gallstones or wall thickening visualized. No sonographic Murphy
sign noted by sonographer.

Common bile duct:

Diameter: 6 mm

Liver:

There is diffuse increased liver echogenicity most commonly seen in
the setting of fatty infiltration. Superimposed inflammation or
fibrosis is not excluded. Clinical correlation is recommended.
Portal vein is patent on color Doppler imaging with normal direction
of blood flow towards the liver.

Other: None.
IMPRESSION: Fatty liver, otherwise unremarkable right upper quadrant ultrasound.

## 2020-12-16 ENCOUNTER — Encounter: Payer: Self-pay | Admitting: Family Medicine

## 2020-12-16 ENCOUNTER — Other Ambulatory Visit: Payer: Self-pay

## 2020-12-16 ENCOUNTER — Telehealth: Payer: Self-pay

## 2020-12-16 ENCOUNTER — Telehealth: Payer: Self-pay | Admitting: Family Medicine

## 2020-12-16 ENCOUNTER — Ambulatory Visit: Payer: BC Managed Care – PPO | Admitting: Family Medicine

## 2020-12-16 VITALS — BP 118/66 | HR 67 | Temp 97.5°F | Ht 66.0 in | Wt 220.6 lb

## 2020-12-16 DIAGNOSIS — E7800 Pure hypercholesterolemia, unspecified: Secondary | ICD-10-CM

## 2020-12-16 DIAGNOSIS — K219 Gastro-esophageal reflux disease without esophagitis: Secondary | ICD-10-CM

## 2020-12-16 DIAGNOSIS — I10 Essential (primary) hypertension: Secondary | ICD-10-CM

## 2020-12-16 DIAGNOSIS — Z0000 Encounter for general adult medical examination without abnormal findings: Secondary | ICD-10-CM

## 2020-12-16 DIAGNOSIS — F3181 Bipolar II disorder: Secondary | ICD-10-CM

## 2020-12-16 DIAGNOSIS — F1029 Alcohol dependence with unspecified alcohol-induced disorder: Secondary | ICD-10-CM

## 2020-12-16 DIAGNOSIS — R7303 Prediabetes: Secondary | ICD-10-CM | POA: Diagnosis not present

## 2020-12-16 DIAGNOSIS — E876 Hypokalemia: Secondary | ICD-10-CM

## 2020-12-16 MED ORDER — ESOMEPRAZOLE MAGNESIUM 40 MG PO CPDR: 40.0000 mg | DELAYED_RELEASE_CAPSULE | Freq: Every day | ORAL | 0 refills | Status: DC

## 2020-12-16 MED ORDER — TRIAMCINOLONE ACETONIDE 0.1 % EX CREA: 1.0000 "application " | TOPICAL_CREAM | Freq: Two times a day (BID) | CUTANEOUS | 0 refills | Status: AC

## 2020-12-16 MED ORDER — LOSARTAN POTASSIUM 100 MG PO TABS: 100.0000 mg | ORAL_TABLET | Freq: Every day | ORAL | 0 refills | Status: DC

## 2020-12-16 MED ORDER — ATENOLOL-CHLORTHALIDONE 50-25 MG PO TABS: 1.0000 | ORAL_TABLET | Freq: Every day | ORAL | 0 refills | Status: DC

## 2020-12-16 MED ORDER — ATORVASTATIN CALCIUM 40 MG PO TABS: 40.0000 mg | ORAL_TABLET | Freq: Every day | ORAL | 0 refills | Status: DC

## 2020-12-16 MED ORDER — ARIPIPRAZOLE 5 MG PO TABS: 2.5000 mg | ORAL_TABLET | Freq: Every day | ORAL | 3 refills | Status: DC

## 2020-12-16 MED ORDER — TRIAMCINOLONE ACETONIDE 0.1 % EX CREA: 1.0000 "application " | TOPICAL_CREAM | Freq: Two times a day (BID) | CUTANEOUS | 0 refills | Status: DC

## 2020-12-16 MED ORDER — ZIPRASIDONE HCL 20 MG PO CAPS: 20.0000 mg | ORAL_CAPSULE | Freq: Two times a day (BID) | ORAL | 0 refills | Status: DC

## 2020-12-16 MED ORDER — ARIPIPRAZOLE 5 MG PO TABS: 2.5000 mg | ORAL_TABLET | Freq: Every day | ORAL | 2 refills | Status: DC

## 2020-12-16 NOTE — Telephone Encounter (Signed)
Pts wife called and said pt forgot to ask for medication refills today, the [facility] [address]: esomeprazole (NEXIUM) 40 MG capsule atorvastatin (LIPITOR) 40 MG tablet atenolol-chlorthalidone (TENORETIC) 50-25 MG per tablet losartan 100 MG tablet Triamcinolone 0.1% cream     This one needs to go to [facility] [address] [facility]: ziprasidone (GEODON) 20 MG capsule   If there are any questions you can call his wife back at [phone], She is on his DPR but it just hasnt been scanned in yet, Almira Coaster has it up fron to scan in

## 2020-12-16 NOTE — Addendum Note (Signed)
Addended by: Lake Bells on: 12/16/2020 02:12 PM   Modules accepted: Orders

## 2020-12-16 NOTE — Telephone Encounter (Signed)
Requested [facility] by [facility]. I called had them canceled [facility] patient aware Rx sent in.

## 2020-12-16 NOTE — Progress Notes (Addendum)
New Patient Office Visit  Subjective:  Patient ID: Andrew Glover, male    DOB: 01/27/1961  Age: 60 y.o. MRN: [id number]  CC:  Chief Complaint  Patient presents with   Establish Care    NP/establish care no concerns. Patient not fasting.     HPI [name] presents for establishment of care by way of transfer.  Our office is closer to his house.  I am seeing his wife.  Longstanding history of bipolar disorder treated successfully and Geodon.  Symptoms are well controlled he tells me he has never seen a psychiatrist the past.  He also smokes some marijuana.  He admits to enjoying several beers daily.  History of prediabetes treated with diet modification.  Blood pressure is controlled with atenolol and chlorthalidone.  History of elevated cholesterol treated with atorvastatin.  Past Medical History:  Diagnosis Date   Bipolar 1 disorder (HCC)    Hyperlipidemia    Hypertension     History reviewed. No pertinent surgical history.  History reviewed. No pertinent family history.  Social History   Socioeconomic History   Marital status: Married    Spouse name: Not on file   Number of children: Not on file   Years of education: Not on file   Highest education level: Not on file  Occupational History   Not on file  Tobacco Use   Smoking status: Never   Smokeless tobacco: Never  Vaping Use   Vaping Use: Never used  Substance and Sexual Activity   Alcohol use: Yes    Alcohol/week: 1.0 standard drink    Types: 1 Cans of beer per week    Comment: several beers daily   Drug use: Yes    Types: Marijuana   Sexual activity: Yes  Other Topics Concern   Not on file  Social History Narrative   Not on file   Social Determinants of Health   Financial Resource Strain: Not on file  Food Insecurity: Not on file  Transportation Needs: Not on file  Physical Activity: Not on file  Stress: Not on file  Social Connections: Not on file  Intimate Partner Violence: Not on file     ROS Review of Systems  Constitutional:  Negative for diaphoresis, fatigue, fever and unexpected weight change.  HENT: Negative.    Eyes:  Negative for photophobia and visual disturbance.  Genitourinary: Negative.   Musculoskeletal:  Negative for gait problem.  Skin:  Negative for pallor and rash.  Neurological:  Negative for speech difficulty and weakness.   Depression screen [facility] 2/9 12/16/2020 12/16/2020  Decreased Interest 0 0  Down, Depressed, Hopeless 0 0  PHQ - 2 Score 0 0  Altered sleeping 3 -  Tired, decreased energy 3 -  Change in appetite 3 -  Feeling bad or failure about yourself  0 -  Trouble concentrating 0 -  Moving slowly or fidgety/restless 1 -  Suicidal thoughts 0 -  PHQ-9 Score 10 -  Difficult doing work/chores Somewhat difficult -      Objective:   Today's Vitals: BP 118/66   Pulse 67   Temp (!) 97.5 F (36.4 C) (Temporal)   Ht 5\' 6"  (1.676 m)   Wt 220 lb 9.6 oz (100.1 kg)   SpO2 97%   BMI 35.61 kg/m   Physical Exam Vitals and nursing note reviewed.  Constitutional:      General: He is not in acute distress.    Appearance: Normal appearance. He is obese. He  is not ill-appearing, toxic-appearing or diaphoretic.  HENT:     Head: Normocephalic and atraumatic.     Right Ear: Tympanic membrane, ear canal and external ear normal.     Left Ear: Tympanic membrane, ear canal and external ear normal.     Mouth/Throat:     Mouth: Mucous membranes are moist.     Pharynx: Oropharynx is clear. No oropharyngeal exudate or posterior oropharyngeal erythema.  Eyes:     General: No scleral icterus.       Right eye: No discharge.        Left eye: No discharge.     Extraocular Movements: Extraocular movements intact.     Conjunctiva/sclera: Conjunctivae normal.     Pupils: Pupils are equal, round, and reactive to light.  Neck:     Vascular: No carotid bruit.  Cardiovascular:     Rate and Rhythm: Normal rate and regular rhythm.  Pulmonary:     Effort:  Pulmonary effort is normal.     Breath sounds: Normal breath sounds.  Abdominal:     General: Bowel sounds are normal. There is no distension.     Palpations: There is no mass.     Tenderness: There is no abdominal tenderness. There is no guarding or rebound.     Hernia: A hernia is present. Hernia is present in the umbilical area and ventral area.  Musculoskeletal:     Cervical back: No rigidity or tenderness.  Lymphadenopathy:     Cervical: No cervical adenopathy.  Skin:    General: Skin is warm and dry.  Neurological:     Mental Status: He is alert and oriented to person, place, and time.  Psychiatric:        Mood and Affect: Mood normal.        Behavior: Behavior normal.    Assessment & Plan:   Problem List Items Addressed This Visit       Cardiovascular and Mediastinum   Essential hypertension - Primary   Relevant Medications   aspirin 81 MG EC tablet   Other Relevant Orders   Basic metabolic panel (Completed)   Urinalysis, Routine w reflex microscopic (Completed)   Microalbumin / creatinine urine ratio (Completed)     Other   Hypokalemia   Relevant Medications   potassium chloride SA (KLOR-CON) 20 MEQ tablet   Elevated low density lipoprotein (LDL) cholesterol level   Relevant Orders   Hepatic function panel (Completed)   Lipid panel (Completed)   Prediabetes   Relevant Orders   Basic metabolic panel (Completed)   Hemoglobin A1c (Completed)   Bipolar 2 disorder (HCC)   Relevant Medications   ARIPiprazole (ABILIFY) 5 MG tablet   Alcohol dependence with unspecified alcohol-induced disorder (HCC)   Healthcare maintenance   Relevant Orders   PSA (Completed)   Microalbumin / creatinine urine ratio (Completed)    Outpatient Encounter Medications as of 12/16/2020  Medication Sig   ascorbic acid (VITAMIN C) 1000 MG tablet Take by mouth.   aspirin 81 MG EC tablet Take 1 tablet by mouth every morning.   cetirizine (ZYRTEC) 10 MG tablet Take 10 mg by mouth daily.    ergocalciferol (VITAMIN D2) 1.25 MG (50000 UT) capsule Take by mouth.   [DISCONTINUED] ARIPiprazole (ABILIFY) 5 MG tablet Take 2.5 mg by mouth daily.     [DISCONTINUED] atenolol-chlorthalidone (TENORETIC) 50-25 MG per tablet Take 1 tablet by mouth daily.     [DISCONTINUED] atorvastatin (LIPITOR) 40 MG tablet Take 40 mg by mouth daily.     [  DISCONTINUED] esomeprazole (NEXIUM) 40 MG capsule Take by mouth.   [DISCONTINUED] losartan (COZAAR) 100 MG tablet PLEASE SEE ATTACHED FOR DETAILED DIRECTIONS   [DISCONTINUED] potassium chloride SA (KLOR-CON) 20 MEQ tablet Take 1 tablet (20 mEq total) by mouth daily.   [DISCONTINUED] ziprasidone (GEODON) 20 MG capsule Take 20 mg by mouth 2 (two) times daily with a meal.   ARIPiprazole (ABILIFY) 5 MG tablet Take 0.5 tablets (2.5 mg total) by mouth daily.   potassium chloride SA (KLOR-CON) 20 MEQ tablet Take 1 tablet (20 mEq total) by mouth daily.   No facility-administered encounter medications on file as of 12/16/2020.    Follow-up: Return in about 3 months (around 03/18/2021), or Make appointment return fasting for blood work..  Given information on alcohol use disorder, bipolar disease, managing hypertension and elevated cholesterol.  Expressed my concern about alcohol usage with his medications for bipolar disease.  We discussed a possible psychiatric referral and/or AA.  He will consider this and we will discuss it again in 3 months.  Mliss Sax, MD

## 2020-12-16 NOTE — Addendum Note (Signed)
Addended by: Andrez Grime on: 12/16/2020 04:44 PM   Modules accepted: Orders

## 2020-12-16 NOTE — Telephone Encounter (Signed)
Pt calling to inform provider that he had one medication missing from appointment today.  Pt requesting for [facility] 2mg  [facility]  Please advise.

## 2020-12-16 NOTE — Telephone Encounter (Signed)
Done

## 2020-12-16 NOTE — Telephone Encounter (Signed)
Pts wife called. All the prescriptions sent to [facility] today [facility] [address] [facility].  Asked that we remove [facility] from his chart.  Thank you

## 2020-12-17 ENCOUNTER — Telehealth: Payer: Self-pay

## 2020-12-17 ENCOUNTER — Other Ambulatory Visit (INDEPENDENT_AMBULATORY_CARE_PROVIDER_SITE_OTHER): Payer: BC Managed Care – PPO

## 2020-12-17 ENCOUNTER — Other Ambulatory Visit: Payer: Self-pay

## 2020-12-17 DIAGNOSIS — I10 Essential (primary) hypertension: Secondary | ICD-10-CM | POA: Diagnosis not present

## 2020-12-17 DIAGNOSIS — R7303 Prediabetes: Secondary | ICD-10-CM | POA: Insufficient documentation

## 2020-12-17 DIAGNOSIS — Z0000 Encounter for general adult medical examination without abnormal findings: Secondary | ICD-10-CM | POA: Diagnosis not present

## 2020-12-17 DIAGNOSIS — Z125 Encounter for screening for malignant neoplasm of prostate: Secondary | ICD-10-CM

## 2020-12-17 DIAGNOSIS — E7800 Pure hypercholesterolemia, unspecified: Secondary | ICD-10-CM

## 2020-12-17 DIAGNOSIS — F1029 Alcohol dependence with unspecified alcohol-induced disorder: Secondary | ICD-10-CM | POA: Insufficient documentation

## 2020-12-17 DIAGNOSIS — F3181 Bipolar II disorder: Secondary | ICD-10-CM | POA: Insufficient documentation

## 2020-12-17 DIAGNOSIS — E876 Hypokalemia: Secondary | ICD-10-CM | POA: Insufficient documentation

## 2020-12-17 LAB — BASIC METABOLIC PANEL
BUN: 21 mg/dL (ref 6–23)
CO2: 29 mEq/L (ref 19–32)
Calcium: 9.1 mg/dL (ref 8.4–10.5)
Chloride: 98 mEq/L (ref 96–112)
Creatinine, Ser: 0.83 mg/dL (ref 0.40–1.50)
GFR: 94.46 mL/min (ref >60.00)
Glucose, Bld: 113 mg/dL — ABNORMAL HIGH (ref 70–99)
Potassium: 3.4 mEq/L — ABNORMAL LOW (ref 3.5–5.1)
Sodium: 137 mEq/L (ref 135–145)

## 2020-12-17 LAB — URINALYSIS, ROUTINE W REFLEX MICROSCOPIC
Bilirubin Urine: NEGATIVE
Hgb urine dipstick: NEGATIVE
Ketones, ur: NEGATIVE
Leukocytes,Ua: NEGATIVE
Nitrite: NEGATIVE
Specific Gravity, Urine: 1.01 (ref 1.000–1.030)
Total Protein, Urine: NEGATIVE
Urine Glucose: NEGATIVE
Urobilinogen, UA: 0.2 (ref 0.0–1.0)
pH: 6.5 (ref 5.0–8.0)

## 2020-12-17 LAB — LDL CHOLESTEROL, DIRECT: Direct LDL: 112 mg/dL

## 2020-12-17 LAB — HEPATIC FUNCTION PANEL
ALT: 23 U/L (ref 0–53)
AST: 17 U/L (ref 0–37)
Albumin: 4.2 g/dL (ref 3.5–5.2)
Alkaline Phosphatase: 75 U/L (ref 39–117)
Bilirubin, Direct: 0.1 mg/dL (ref 0.0–0.3)
Total Bilirubin: 0.7 mg/dL (ref 0.2–1.2)
Total Protein: 6.3 g/dL (ref 6.0–8.3)

## 2020-12-17 LAB — MICROALBUMIN / CREATININE URINE RATIO
Creatinine,U: 64 mg/dL
Microalb Creat Ratio: 1.1 mg/g (ref 0.0–30.0)
Microalb, Ur: <0.7 mg/dL (ref 0.0–1.9)

## 2020-12-17 LAB — LIPID PANEL
Cholesterol: 200 mg/dL (ref 0–200)
HDL: 38.3 mg/dL — ABNORMAL LOW (ref >39.00)
NonHDL: 162.04
Total CHOL/HDL Ratio: 5
Triglycerides: 389 mg/dL — ABNORMAL HIGH (ref 0.0–149.0)
VLDL: 77.8 mg/dL — ABNORMAL HIGH (ref 0.0–40.0)

## 2020-12-17 LAB — PSA: PSA: 1.1 ng/mL (ref 0.10–4.00)

## 2020-12-17 LAB — HEMOGLOBIN A1C: Hgb A1c MFr Bld: 6.1 % (ref 4.6–6.5)

## 2020-12-17 MED ORDER — POTASSIUM CHLORIDE CRYS ER 20 MEQ PO TBCR: 20.0000 meq | EXTENDED_RELEASE_TABLET | Freq: Every day | ORAL | 1 refills | Status: DC

## 2020-12-17 MED ORDER — POTASSIUM CHLORIDE CRYS ER 20 MEQ PO TBCR: 20.0000 meq | EXTENDED_RELEASE_TABLET | Freq: Every day | ORAL | 3 refills | Status: DC

## 2020-12-17 NOTE — Telephone Encounter (Signed)
Pts wife Orlie Pollen called. She wanted to know if prescription for Abilify should be for 5 mg. He usually takes 2 mg.  They are not sure what KLOR CON was prescribed for.  Would like a call back to clarify at [phone]  Thanks

## 2020-12-17 NOTE — Progress Notes (Signed)
Labs show:  Mildly elevated Ldl cholesterol with significantly elevated blood fats. Where you fasting for blood work?  Diabetes remains controlled with diet but HgA1c is high normal. Glucose was mildly elevated. Please exercise and lose weight.   Potassium has been low. This is due to the chlorthalidone. Have sent in a potassium pill for you to take. Follow up in 3 months for recheck is important.

## 2020-12-17 NOTE — Progress Notes (Signed)
Per orders of Dr. Doreene Burke pt is here for labs pt tolerated draw, pt was able to leave urine sample

## 2020-12-17 NOTE — Addendum Note (Signed)
Addended by: Nadene Rubins A on: 12/17/2020 01:09 PM   Modules accepted: Orders

## 2020-12-20 NOTE — Telephone Encounter (Signed)
Returned call

## 2020-12-27 DIAGNOSIS — G4733 Obstructive sleep apnea (adult) (pediatric): Secondary | ICD-10-CM | POA: Diagnosis not present

## 2021-03-17 ENCOUNTER — Other Ambulatory Visit: Payer: Self-pay

## 2021-03-18 ENCOUNTER — Encounter: Payer: Self-pay | Admitting: Family Medicine

## 2021-03-18 ENCOUNTER — Ambulatory Visit: Payer: BC Managed Care – PPO | Admitting: Family Medicine

## 2021-03-18 VITALS — BP 132/70 | HR 68 | Temp 97.7°F | Ht 66.0 in | Wt 218.2 lb

## 2021-03-18 DIAGNOSIS — F1029 Alcohol dependence with unspecified alcohol-induced disorder: Secondary | ICD-10-CM

## 2021-03-18 DIAGNOSIS — F3181 Bipolar II disorder: Secondary | ICD-10-CM | POA: Diagnosis not present

## 2021-03-18 DIAGNOSIS — E876 Hypokalemia: Secondary | ICD-10-CM

## 2021-03-18 DIAGNOSIS — E782 Mixed hyperlipidemia: Secondary | ICD-10-CM

## 2021-03-18 DIAGNOSIS — R7303 Prediabetes: Secondary | ICD-10-CM

## 2021-03-18 DIAGNOSIS — I10 Essential (primary) hypertension: Secondary | ICD-10-CM | POA: Diagnosis not present

## 2021-03-18 NOTE — Progress Notes (Addendum)
Established Patient Office Visit  Subjective:  Patient ID: Andrew Glover, male    DOB: 1960-07-27  Age: 60 y.o. MRN: [id number]  CC:  Chief Complaint  Patient presents with   Follow-up    3 month follow up, no concerns.     HPI [name] presents for hypertension, hyperlipidemia, prediabetes,, hypokalemia, bipolar disorder, alcohol abuse with marijuana use.  Again tells me that his mood has been stabilized with aripiprazole, ziprasidone, marihuana and alcohol.  Alcohol comes and goes in his life.  Denies morning shakes.  He is quit alcohol for up to a year in the past.  Blood pressure is controlled with Tenoretic and losartan.  He is supplementing with potassium chloride 20 mEq.  He was fasting for his last lipid profile.  Compliant with moderate dose atorvastatin.  Thyroid has not been checked in some time.  He sees his eye doctor yearly.  He does not take a flu shot.  Continues to work full-time on an [employer] stage [employer].  Past Medical History:  Diagnosis Date   Bipolar 1 disorder (HCC)    Hyperlipidemia    Hypertension     No past surgical history on file.  No family history on file.  Social History   Socioeconomic History   Marital status: Married    Spouse name: Not on file   Number of children: Not on file   Years of education: Not on file   Highest education level: Not on file  Occupational History   Not on file  Tobacco Use   Smoking status: Never   Smokeless tobacco: Never  Vaping Use   Vaping Use: Never used  Substance and Sexual Activity   Alcohol use: Yes    Alcohol/week: 1.0 standard drink    Types: 1 Cans of beer per week    Comment: several beers daily   Drug use: Yes    Types: Marijuana   Sexual activity: Yes  Other Topics Concern   Not on file  Social History Narrative   Not on file   Social Determinants of Health   Financial Resource Strain: Not on file  Food Insecurity: Not on file  Transportation Needs: Not on file  Physical Activity:  Not on file  Stress: Not on file  Social Connections: Not on file  Intimate Partner Violence: Not on file    Outpatient Medications Prior to Visit  Medication Sig Dispense Refill   ARIPiprazole (ABILIFY) 5 MG tablet Take 0.5 tablets (2.5 mg total) by mouth daily. 45 tablet 2   ARIPiprazole (ABILIFY) 5 MG tablet Take 0.5 tablets (2.5 mg total) by mouth daily. 45 tablet 3   ascorbic acid (VITAMIN C) 1000 MG tablet Take by mouth.     aspirin 81 MG EC tablet Take 1 tablet by mouth every morning.     atenolol-chlorthalidone (TENORETIC) 50-25 MG tablet Take 1 tablet by mouth daily. 90 tablet 0   atorvastatin (LIPITOR) 40 MG tablet Take 1 tablet (40 mg total) by mouth daily. Take 40 mg by mouth daily. 90 tablet 0   cetirizine (ZYRTEC) 10 MG tablet Take 10 mg by mouth daily.     ergocalciferol (VITAMIN D2) 1.25 MG (50000 UT) capsule Take by mouth.     esomeprazole (NEXIUM) 40 MG capsule Take 1 capsule (40 mg total) by mouth daily. 90 capsule 0   losartan (COZAAR) 100 MG tablet Take 1 tablet (100 mg total) by mouth daily. 90 tablet 0   potassium chloride SA (KLOR-CON) 20  MEQ tablet Take 1 tablet (20 mEq total) by mouth daily. 90 tablet 1   triamcinolone cream (KENALOG) 0.1 % Apply 1 application topically 2 (two) times daily. 30 g 0   ziprasidone (GEODON) 20 MG capsule Take 1 capsule (20 mg total) by mouth 2 (two) times daily with a meal. 90 capsule 0   No facility-administered medications prior to visit.    Allergies  Allergen Reactions   Sulfa Antibiotics Itching and Hives   Codeine Itching and Rash    ROS Review of Systems  Constitutional: Negative.   Respiratory: Negative.    Cardiovascular: Negative.   Gastrointestinal: Negative.   Genitourinary: Negative.   Psychiatric/Behavioral:  Negative for behavioral problems and dysphoric mood. The patient is not nervous/anxious.      Objective:    Physical Exam Vitals and nursing note reviewed.  Constitutional:      General: He is not  in acute distress.    Appearance: Normal appearance. He is not ill-appearing, toxic-appearing or diaphoretic.  HENT:     Head: Normocephalic and atraumatic.     Right Ear: Tympanic membrane, ear canal and external ear normal.     Left Ear: Tympanic membrane, ear canal and external ear normal.     Mouth/Throat:     Mouth: Mucous membranes are moist.     Pharynx: Oropharynx is clear. No oropharyngeal exudate or posterior oropharyngeal erythema.  Eyes:     General:        Right eye: No discharge.        Left eye: No discharge.     Extraocular Movements: Extraocular movements intact.     Conjunctiva/sclera: Conjunctivae normal.     Pupils: Pupils are equal, round, and reactive to light.  Neck:     Vascular: No carotid bruit.  Cardiovascular:     Rate and Rhythm: Normal rate and regular rhythm.  Pulmonary:     Effort: Pulmonary effort is normal.     Breath sounds: Normal breath sounds.  Abdominal:     General: Bowel sounds are normal.  Musculoskeletal:     Cervical back: No rigidity or tenderness.  Lymphadenopathy:     Cervical: No cervical adenopathy.  Skin:    General: Skin is warm and dry.  Neurological:     Mental Status: He is alert and oriented to person, place, and time.  Psychiatric:        Mood and Affect: Mood normal.        Behavior: Behavior normal.    BP 132/70 (BP Location: Right Arm, Patient Position: Sitting, Cuff Size: Normal)   Pulse 68   Temp 97.7 F (36.5 C) (Temporal)   Ht 5\' 6"  (1.676 m)   Wt 218 lb 3.2 oz (99 kg)   SpO2 95%   BMI 35.22 kg/m  Wt Readings from Last 3 Encounters:  03/18/21 218 lb 3.2 oz (99 kg)  12/16/20 220 lb 9.6 oz (100.1 kg)  03/20/11 178 lb (80.7 kg)     Health Maintenance Due  Topic Date Due   HIV Screening  Never done   Hepatitis C Screening  Never done   TETANUS/TDAP  Never done   Zoster Vaccines- Shingrix (1 of 2) Never done   INFLUENZA VACCINE  Never done    There are no preventive care reminders to display for  this patient.  No results found for: TSH Lab Results  Component Value Date   WBC 9.0 03/20/2011   HGB 15.9 03/20/2011   HCT 43.4 03/20/2011  MCV 84.6 03/20/2011   PLT 176 03/20/2011   Lab Results  Component Value Date   NA 137 12/17/2020   K 3.4 (L) 12/17/2020   CO2 29 12/17/2020   GLUCOSE 113 (H) 12/17/2020   BUN 21 12/17/2020   CREATININE 0.83 12/17/2020   BILITOT 0.7 12/17/2020   ALKPHOS 75 12/17/2020   AST 17 12/17/2020   ALT 23 12/17/2020   PROT 6.3 12/17/2020   ALBUMIN 4.2 12/17/2020   CALCIUM 9.1 12/17/2020   GFR 94.46 12/17/2020   Lab Results  Component Value Date   CHOL 200 12/17/2020   Lab Results  Component Value Date   HDL 38.30 (L) 12/17/2020   No results found for: [facility] Lab Results  Component Value Date   TRIG 389.0 (H) 12/17/2020   Lab Results  Component Value Date   CHOLHDL 5 12/17/2020   Lab Results  Component Value Date   HGBA1C 6.1 12/17/2020      Assessment & Plan:   Problem List Items Addressed This Visit       Cardiovascular and Mediastinum   Essential hypertension - Primary   Relevant Orders   CBC   Comprehensive metabolic panel   Urinalysis, Routine w reflex microscopic   Microalbumin / creatinine urine ratio   TSH     Other   Hypokalemia   Relevant Orders   Comprehensive metabolic panel   Prediabetes   Relevant Orders   Comprehensive metabolic panel   Hemoglobin A1c   Urinalysis, Routine w reflex microscopic   Microalbumin / creatinine urine ratio   TSH   Bipolar 2 disorder (HCC)   Relevant Orders   Ambulatory referral to Psychiatry   Alcohol dependence with unspecified alcohol-induced disorder (HCC)   Relevant Orders   Comprehensive metabolic panel   Ambulatory referral to Psychiatry   Mixed hyperlipidemia   Relevant Orders   Comprehensive metabolic panel   LDL cholesterol, direct   Lipid panel    No orders of the defined types were placed in this encounter.   Follow-up: Return in about 3 months  (around 06/18/2021), or Return fasting for blood work..  Information was given about hypokalemia, preventing high cholesterol and alcohol abuse.  Expressed my concern again about his alcohol abuse in combination with marijuana and to atypical antipsychotics.  He assures me that this is worked well for him over the years.   Mliss Sax, MD

## 2021-03-29 DIAGNOSIS — G4733 Obstructive sleep apnea (adult) (pediatric): Secondary | ICD-10-CM | POA: Diagnosis not present

## 2021-03-31 ENCOUNTER — Other Ambulatory Visit: Payer: Self-pay

## 2021-03-31 ENCOUNTER — Other Ambulatory Visit (INDEPENDENT_AMBULATORY_CARE_PROVIDER_SITE_OTHER): Payer: BC Managed Care – PPO

## 2021-03-31 DIAGNOSIS — R7303 Prediabetes: Secondary | ICD-10-CM

## 2021-03-31 DIAGNOSIS — E876 Hypokalemia: Secondary | ICD-10-CM

## 2021-03-31 DIAGNOSIS — I10 Essential (primary) hypertension: Secondary | ICD-10-CM

## 2021-03-31 DIAGNOSIS — E782 Mixed hyperlipidemia: Secondary | ICD-10-CM | POA: Diagnosis not present

## 2021-03-31 DIAGNOSIS — F1029 Alcohol dependence with unspecified alcohol-induced disorder: Secondary | ICD-10-CM

## 2021-03-31 LAB — COMPREHENSIVE METABOLIC PANEL
ALT: 33 U/L (ref 0–53)
AST: 22 U/L (ref 0–37)
Albumin: 4.5 g/dL (ref 3.5–5.2)
Alkaline Phosphatase: 75 U/L (ref 39–117)
BUN: 11 mg/dL (ref 6–23)
CO2: 31 mEq/L (ref 19–32)
Calcium: 9.4 mg/dL (ref 8.4–10.5)
Chloride: 98 mEq/L (ref 96–112)
Creatinine, Ser: 1.04 mg/dL (ref 0.40–1.50)
GFR: 77.34 mL/min (ref >60.00)
Glucose, Bld: 110 mg/dL — ABNORMAL HIGH (ref 70–99)
Potassium: 3.6 mEq/L (ref 3.5–5.1)
Sodium: 139 mEq/L (ref 135–145)
Total Bilirubin: 1 mg/dL (ref 0.2–1.2)
Total Protein: 6.5 g/dL (ref 6.0–8.3)

## 2021-03-31 LAB — CBC
HCT: 45.8 % (ref 39.0–52.0)
Hemoglobin: 15.9 g/dL (ref 13.0–17.0)
MCHC: 34.7 g/dL (ref 30.0–36.0)
MCV: 90.7 fl (ref 78.0–100.0)
Platelets: 193 10*3/uL (ref 150.0–400.0)
RBC: 5.05 Mil/uL (ref 4.22–5.81)
RDW: 13.6 % (ref 11.5–15.5)
WBC: 9.2 10*3/uL (ref 4.0–10.5)

## 2021-03-31 LAB — TSH: TSH: 3.44 u[IU]/mL (ref 0.35–5.50)

## 2021-03-31 LAB — LIPID PANEL
Cholesterol: 173 mg/dL (ref 0–200)
HDL: 43.4 mg/dL (ref >39.00)
NonHDL: 129.14
Total CHOL/HDL Ratio: 4
Triglycerides: 211 mg/dL — ABNORMAL HIGH (ref 0.0–149.0)
VLDL: 42.2 mg/dL — ABNORMAL HIGH (ref 0.0–40.0)

## 2021-03-31 LAB — LDL CHOLESTEROL, DIRECT: Direct LDL: 111 mg/dL

## 2021-03-31 LAB — HEMOGLOBIN A1C: Hgb A1c MFr Bld: 6.1 % (ref 4.6–6.5)

## 2021-03-31 NOTE — Addendum Note (Signed)
Addended by: Varney Biles on: 03/31/2021 02:02 PM   Modules accepted: Orders

## 2021-04-08 ENCOUNTER — Other Ambulatory Visit: Payer: Self-pay | Admitting: Family Medicine

## 2021-04-08 NOTE — Telephone Encounter (Signed)
Refill request for pending Rx last OV 03/18/21 last refill 12/16/20. Please advise

## 2021-04-17 ENCOUNTER — Other Ambulatory Visit: Payer: Self-pay | Admitting: Family Medicine

## 2021-04-17 DIAGNOSIS — K219 Gastro-esophageal reflux disease without esophagitis: Secondary | ICD-10-CM

## 2021-04-17 DIAGNOSIS — Z0000 Encounter for general adult medical examination without abnormal findings: Secondary | ICD-10-CM

## 2021-04-29 DIAGNOSIS — G4733 Obstructive sleep apnea (adult) (pediatric): Secondary | ICD-10-CM | POA: Diagnosis not present

## 2021-05-14 ENCOUNTER — Other Ambulatory Visit: Payer: Self-pay | Admitting: Family Medicine

## 2021-05-29 DIAGNOSIS — G4733 Obstructive sleep apnea (adult) (pediatric): Secondary | ICD-10-CM | POA: Diagnosis not present

## 2021-06-27 DIAGNOSIS — G4733 Obstructive sleep apnea (adult) (pediatric): Secondary | ICD-10-CM | POA: Diagnosis not present

## 2021-06-29 DIAGNOSIS — G4733 Obstructive sleep apnea (adult) (pediatric): Secondary | ICD-10-CM | POA: Diagnosis not present

## 2021-07-04 ENCOUNTER — Other Ambulatory Visit: Payer: Self-pay | Admitting: Family Medicine

## 2021-07-11 ENCOUNTER — Other Ambulatory Visit: Payer: Self-pay | Admitting: Family Medicine

## 2021-07-28 DIAGNOSIS — G4733 Obstructive sleep apnea (adult) (pediatric): Secondary | ICD-10-CM | POA: Diagnosis not present

## 2021-08-04 DIAGNOSIS — H25813 Combined forms of age-related cataract, bilateral: Secondary | ICD-10-CM | POA: Diagnosis not present

## 2021-08-04 DIAGNOSIS — H40053 Ocular hypertension, bilateral: Secondary | ICD-10-CM | POA: Diagnosis not present

## 2021-08-04 DIAGNOSIS — H40013 Open angle with borderline findings, low risk, bilateral: Secondary | ICD-10-CM | POA: Diagnosis not present

## 2021-08-18 ENCOUNTER — Other Ambulatory Visit: Payer: Self-pay | Admitting: Family Medicine

## 2021-08-30 ENCOUNTER — Other Ambulatory Visit: Payer: Self-pay | Admitting: Family Medicine

## 2021-08-31 DIAGNOSIS — G4733 Obstructive sleep apnea (adult) (pediatric): Secondary | ICD-10-CM | POA: Diagnosis not present

## 2021-09-11 ENCOUNTER — Other Ambulatory Visit: Payer: Self-pay | Admitting: Family Medicine

## 2021-09-11 DIAGNOSIS — E876 Hypokalemia: Secondary | ICD-10-CM

## 2021-09-14 ENCOUNTER — Other Ambulatory Visit: Payer: Self-pay | Admitting: Family Medicine

## 2021-09-19 ENCOUNTER — Telehealth: Payer: Self-pay | Admitting: Family Medicine

## 2021-09-19 NOTE — Telephone Encounter (Signed)
Pts wife called and stated Pt let his med Ziprasidone run out.scheduled him an appt for the 20th. She is trying to get him here at 4:20 today. If he cannot get off work she said it's very urgent he get enough of this med to last until the 20th. Please advise. ?

## 2021-09-20 ENCOUNTER — Encounter: Payer: Self-pay | Admitting: Family Medicine

## 2021-09-20 ENCOUNTER — Ambulatory Visit (INDEPENDENT_AMBULATORY_CARE_PROVIDER_SITE_OTHER): Payer: BC Managed Care – PPO | Admitting: Family Medicine

## 2021-09-20 ENCOUNTER — Other Ambulatory Visit: Payer: Self-pay | Admitting: Family Medicine

## 2021-09-20 VITALS — BP 116/68 | HR 76 | Temp 97.8°F | Ht 66.0 in | Wt 227.0 lb

## 2021-09-20 DIAGNOSIS — F1029 Alcohol dependence with unspecified alcohol-induced disorder: Secondary | ICD-10-CM

## 2021-09-20 DIAGNOSIS — E782 Mixed hyperlipidemia: Secondary | ICD-10-CM | POA: Diagnosis not present

## 2021-09-20 DIAGNOSIS — Z91199 Patient's noncompliance with other medical treatment and regimen due to unspecified reason: Secondary | ICD-10-CM

## 2021-09-20 DIAGNOSIS — R7303 Prediabetes: Secondary | ICD-10-CM

## 2021-09-20 DIAGNOSIS — E559 Vitamin D deficiency, unspecified: Secondary | ICD-10-CM

## 2021-09-20 DIAGNOSIS — I10 Essential (primary) hypertension: Secondary | ICD-10-CM

## 2021-09-20 DIAGNOSIS — F3181 Bipolar II disorder: Secondary | ICD-10-CM

## 2021-09-20 MED ORDER — ATENOLOL-CHLORTHALIDONE 50-25 MG PO TABS: 1.0000 | ORAL_TABLET | Freq: Every day | ORAL | 1 refills | Status: AC

## 2021-09-20 MED ORDER — ATORVASTATIN CALCIUM 40 MG PO TABS: 40.0000 mg | ORAL_TABLET | Freq: Every day | ORAL | 1 refills | Status: AC

## 2021-09-20 MED ORDER — ZIPRASIDONE HCL 20 MG PO CAPS: ORAL_CAPSULE | ORAL | 0 refills | Status: DC

## 2021-09-20 MED ORDER — ZIPRASIDONE HCL 20 MG PO CAPS: ORAL_CAPSULE | ORAL | 1 refills | Status: DC

## 2021-09-20 MED ORDER — ARIPIPRAZOLE 5 MG PO TABS: 2.5000 mg | ORAL_TABLET | Freq: Every day | ORAL | 1 refills | Status: DC

## 2021-09-20 MED ORDER — LOSARTAN POTASSIUM 100 MG PO TABS: 100.0000 mg | ORAL_TABLET | Freq: Every day | ORAL | 1 refills | Status: AC

## 2021-09-20 NOTE — Telephone Encounter (Signed)
Patient in office for visit 

## 2021-09-20 NOTE — Progress Notes (Signed)
? ?Established Patient Office Visit ? ?Subjective:  ?Patient ID: Andrew Glover, male    DOB: [birth year]  Age: 61 y.o. MRN: [id number] ? ?CC:  ?Chief Complaint  ?Patient presents with  ? Annual Exam  ?  CPE, no concerns had breakfast around 8am.   ? ? ?HPI ?Andrew Glover presents for a follow-up of hypertension, hyperlipidemia, prediabetes, alcohol use disorder and bipolar disease.  He was seen in October of last year and instructed to return in January.  He returns today for follow-up and refill of his medicines.  He had been referred to psychiatry.  I had told him at that time that I was uncomfortable with his medical regimen and concurrent use of alcohol and marijuana.  When he told me that he decided not to go to psychiatry I told him I was upset.  He said not to "get upset with me that he had been waiting an hour to see me"and then he got up and left.    ? ?Past Medical History:  ?Diagnosis Date  ? Bipolar 1 disorder (HCC)   ? Hyperlipidemia   ? Hypertension   ? ? ?History reviewed. No pertinent surgical history. ? ?History reviewed. No pertinent family history. ? ?Social History  ? ?Socioeconomic History  ? Marital status: Married  ?  Spouse name: Not on file  ? Number of children: Not on file  ? Years of education: Not on file  ? Highest education level: Not on file  ?Occupational History  ? Not on file  ?Tobacco Use  ? Smoking status: Never  ? Smokeless tobacco: Never  ?Vaping Use  ? Vaping Use: Never used  ?Substance and Sexual Activity  ? Alcohol use: Yes  ?  Alcohol/week: 42.0 standard drinks  ?  Types: 42 Cans of beer per week  ?  Comment: several beers daily  ? Drug use: Yes  ?  Types: Marijuana  ? Sexual activity: Yes  ?Other Topics Concern  ? Not on file  ?Social History Narrative  ? Not on file  ? ?Social Determinants of Health  ? ?Financial Resource Strain: Not on file  ?Food Insecurity: Not on file  ?Transportation Needs: Not on file  ?Physical Activity: Not on file  ?Stress: Not on file  ?Social  Connections: Not on file  ?Intimate Partner Violence: Not on file  ? ? ?Outpatient Medications Prior to Visit  ?Medication Sig Dispense Refill  ? ascorbic acid (VITAMIN C) 1000 MG tablet Take by mouth.    ? aspirin 81 MG EC tablet Take 1 tablet by mouth every morning.    ? cetirizine (ZYRTEC) 10 MG tablet Take 10 mg by mouth daily.    ? ergocalciferol (VITAMIN D2) 1.25 MG (50000 UT) capsule Take by mouth.    ? esomeprazole (NEXIUM) 40 MG capsule TAKE 1 CAPSULE (40 MG TOTAL) BY MOUTH DAILY. 90 capsule 2  ? KLOR-CON M20 20 MEQ tablet TAKE 1 TABLET BY MOUTH EVERY DAY 90 tablet 1  ? triamcinolone cream (KENALOG) 0.1 % Apply 1 application topically 2 (two) times daily. 30 g 0  ? ARIPiprazole (ABILIFY) 5 MG tablet Take 0.5 tablets (2.5 mg total) by mouth daily. 45 tablet 2  ? atenolol-chlorthalidone (TENORETIC) 50-25 MG tablet TAKE 1 TABLET BY MOUTH EVERY DAY 90 tablet 0  ? atorvastatin (LIPITOR) 40 MG tablet TAKE 1 TABLET (40 MG TOTAL) BY MOUTH DAILY. TAKE 40 MG BY MOUTH DAILY. 90 tablet 0  ? losartan (COZAAR) 100 MG tablet TAKE 1  TABLET BY MOUTH EVERY DAY 90 tablet 0  ? ziprasidone (GEODON) 20 MG capsule TAKE ONE CAPSULE BY MOUTH TWICE A DAY WITH MEALS 60 capsule 0  ? ARIPiprazole (ABILIFY) 5 MG tablet Take 0.5 tablets (2.5 mg total) by mouth daily. 45 tablet 3  ? ?No facility-administered medications prior to visit.  ? ? ?Allergies  ?Allergen Reactions  ? Sulfa Antibiotics Itching and Hives  ? Codeine Itching and Rash  ? ? ?ROS ?Review of Systems ? ?  09/20/2021  ?  5:25 PM 09/20/2021  ?  4:50 PM 03/18/2021  ?  4:11 PM  ?Depression screen PHQ 2/9  ?Decreased Interest 0 0 0  ?Down, Depressed, Hopeless 0 0 0  ?PHQ - 2 Score 0 0 0  ?Altered sleeping 0    ?Tired, decreased energy 0    ?Change in appetite 0    ?Feeling bad or failure about yourself  0    ?Trouble concentrating 0    ?Moving slowly or fidgety/restless 0    ?Suicidal thoughts 0    ?PHQ-9 Score 0    ?Difficult doing work/chores Not difficult at all    ? ? ? ?   ?Objective:  ?  ?Physical Exam ?Vitals and nursing note reviewed.  ?Constitutional:   ?   Appearance: Normal appearance.  ?Eyes:  ?   General: No scleral icterus.    ?   Right eye: No discharge.     ?   Left eye: No discharge.  ?   Conjunctiva/sclera: Conjunctivae normal.  ?Pulmonary:  ?   Effort: Pulmonary effort is normal.  ?Skin: ?   General: Skin is warm and dry.  ?Neurological:  ?   Mental Status: He is alert. Mental status is at baseline.  ?Psychiatric:     ?   Mood and Affect: Affect is angry.  ? ? ?BP 116/68 (BP Location: Right Arm, Patient Position: Sitting, Cuff Size: Large)   Pulse 76   Temp 97.8 ?F (36.6 ?C) (Temporal)   Ht 5\' 6"  (1.676 m)   Wt 227 lb (103 kg)   SpO2 95%   BMI 36.64 kg/m?  ?Wt Readings from Last 3 Encounters:  ?09/20/21 227 lb (103 kg)  ?03/18/21 218 lb 3.2 oz (99 kg)  ?12/16/20 220 lb 9.6 oz (100.1 kg)  ? ? ? ?Health Maintenance Due  ?Topic Date Due  ? HIV Screening  Never done  ? Hepatitis C Screening  Never done  ? ? ?There are no preventive care reminders to display for this patient. ? ?Lab Results  ?Component Value Date  ? TSH 3.44 03/31/2021  ? ?Lab Results  ?Component Value Date  ? WBC 9.2 03/31/2021  ? HGB 15.9 03/31/2021  ? HCT 45.8 03/31/2021  ? MCV 90.7 03/31/2021  ? PLT 193.0 03/31/2021  ? ?Lab Results  ?Component Value Date  ? NA 139 03/31/2021  ? K 3.6 03/31/2021  ? CO2 31 03/31/2021  ? GLUCOSE 110 (H) 03/31/2021  ? BUN 11 03/31/2021  ? CREATININE 1.04 03/31/2021  ? BILITOT 1.0 03/31/2021  ? ALKPHOS 75 03/31/2021  ? AST 22 03/31/2021  ? ALT 33 03/31/2021  ? PROT 6.5 03/31/2021  ? ALBUMIN 4.5 03/31/2021  ? CALCIUM 9.4 03/31/2021  ? GFR 77.34 03/31/2021  ? ?Lab Results  ?Component Value Date  ? CHOL 173 03/31/2021  ? ?Lab Results  ?Component Value Date  ? HDL 43.40 03/31/2021  ? ?No results found for: LDLCALC ?Lab Results  ?Component Value Date  ? TRIG  211.0 (H) 03/31/2021  ? ?Lab Results  ?Component Value Date  ? CHOLHDL 4 03/31/2021  ? ?Lab Results  ?Component Value  Date  ? HGBA1C 6.1 03/31/2021  ? ? ?  ?Assessment & Plan:  ? ?Problem List Items Addressed This Visit   ? ?  ? Cardiovascular and Mediastinum  ? Essential hypertension - Primary  ? Relevant Medications  ? atenolol-chlorthalidone (TENORETIC) 50-25 MG tablet  ? atorvastatin (LIPITOR) 40 MG tablet  ? losartan (COZAAR) 100 MG tablet  ?  ? Other  ? Prediabetes  ? Bipolar 2 disorder (HCC)  ? Relevant Medications  ? ARIPiprazole (ABILIFY) 5 MG tablet  ? ziprasidone (GEODON) 20 MG capsule  ? Alcohol dependence with unspecified alcohol-induced disorder (HCC)  ? Mixed hyperlipidemia  ? Relevant Medications  ? atenolol-chlorthalidone (TENORETIC) 50-25 MG tablet  ? atorvastatin (LIPITOR) 40 MG tablet  ? losartan (COZAAR) 100 MG tablet  ? Vitamin D deficiency  ? ?Other Visit Diagnoses   ? ? Non-compliant patient      ? ?  ? ? ?Meds ordered this encounter  ?Medications  ? ARIPiprazole (ABILIFY) 5 MG tablet  ?  Sig: Take 0.5 tablets (2.5 mg total) by mouth daily.  ?  Dispense:  15 tablet  ?  Refill:  1  ? atenolol-chlorthalidone (TENORETIC) 50-25 MG tablet  ?  Sig: Take 1 tablet by mouth daily.  ?  Dispense:  30 tablet  ?  Refill:  1  ? atorvastatin (LIPITOR) 40 MG tablet  ?  Sig: Take 1 tablet (40 mg total) by mouth daily. Take 40 mg by mouth daily.  ?  Dispense:  30 tablet  ?  Refill:  1  ? losartan (COZAAR) 100 MG tablet  ?  Sig: Take 1 tablet (100 mg total) by mouth daily.  ?  Dispense:  30 tablet  ?  Refill:  1  ? DISCONTD: ziprasidone (GEODON) 20 MG capsule  ?  Sig: TAKE ONE CAPSULE BY MOUTH TWICE A DAY WITH MEALS  ?  Dispense:  60 capsule  ?  Refill:  0  ? ziprasidone (GEODON) 20 MG capsule  ?  Sig: TAKE ONE CAPSULE BY MOUTH TWICE A DAY WITH MEALS  ?  Dispense:  60 capsule  ?  Refill:  1  ? ? ?Follow-up: No follow-ups on file.  ?Patient left abruptly and was angry with me after I confronted him about not going to see the psychiatrist.  He was also angry about waiting for an hour to see me.  I filled his medicines for 30  days with 1 refill.  I am recommending discharge. ? ?Mliss Sax, MD ?

## 2021-09-21 ENCOUNTER — Telehealth: Payer: Self-pay | Admitting: Family Medicine

## 2021-09-21 NOTE — Telephone Encounter (Signed)
Rx sent in

## 2021-09-21 NOTE — Telephone Encounter (Signed)
Pt is needing his ziprasidone (GEODON) 20 MG capsule [[id number]] sent to [facility]. He is able to get a big discount with this pharmacy instead of [facility]. Please advise at [phone] if any further questions.  ?He is wanting this by today ?

## 2021-09-22 ENCOUNTER — Telehealth: Payer: Self-pay | Admitting: Family Medicine

## 2021-09-22 ENCOUNTER — Encounter: Payer: Self-pay | Admitting: Family Medicine

## 2021-09-22 ENCOUNTER — Other Ambulatory Visit: Payer: Self-pay

## 2021-09-22 DIAGNOSIS — F3181 Bipolar II disorder: Secondary | ICD-10-CM

## 2021-09-22 MED ORDER — ZIPRASIDONE HCL 20 MG PO CAPS: ORAL_CAPSULE | ORAL | 1 refills | Status: DC

## 2021-09-22 NOTE — Telephone Encounter (Signed)
Pt called Andrew Glover and they are sating they have not gotten this script yet. Please resend ?

## 2021-09-22 NOTE — Telephone Encounter (Signed)
Called patient to inform that medication was sent in to requested pharmacy. No answer LMTCB  ?

## 2021-09-22 NOTE — Telephone Encounter (Signed)
Letter generated to dismiss. Will have Dr. Doreene Burke sign and mail out 09/23/21 due to noncompliant behavior and not following Health Care plan. ?

## 2021-09-29 ENCOUNTER — Ambulatory Visit: Payer: BC Managed Care – PPO | Admitting: Family Medicine

## 2021-10-07 DIAGNOSIS — G4733 Obstructive sleep apnea (adult) (pediatric): Secondary | ICD-10-CM | POA: Diagnosis not present

## 2021-11-06 DIAGNOSIS — G4733 Obstructive sleep apnea (adult) (pediatric): Secondary | ICD-10-CM | POA: Diagnosis not present

## 2021-12-07 DIAGNOSIS — G4733 Obstructive sleep apnea (adult) (pediatric): Secondary | ICD-10-CM | POA: Diagnosis not present

## 2022-01-11 DIAGNOSIS — L304 Erythema intertrigo: Secondary | ICD-10-CM | POA: Diagnosis not present

## 2022-01-11 DIAGNOSIS — B351 Tinea unguium: Secondary | ICD-10-CM | POA: Diagnosis not present

## 2022-01-11 DIAGNOSIS — L400 Psoriasis vulgaris: Secondary | ICD-10-CM | POA: Diagnosis not present

## 2022-01-11 DIAGNOSIS — L918 Other hypertrophic disorders of the skin: Secondary | ICD-10-CM | POA: Diagnosis not present

## 2022-01-20 DIAGNOSIS — H40053 Ocular hypertension, bilateral: Secondary | ICD-10-CM | POA: Diagnosis not present

## 2022-01-20 DIAGNOSIS — H25813 Combined forms of age-related cataract, bilateral: Secondary | ICD-10-CM | POA: Diagnosis not present

## 2022-01-20 DIAGNOSIS — H21541 Posterior synechiae (iris), right eye: Secondary | ICD-10-CM | POA: Diagnosis not present

## 2022-01-20 DIAGNOSIS — H40013 Open angle with borderline findings, low risk, bilateral: Secondary | ICD-10-CM | POA: Diagnosis not present

## 2022-02-28 DIAGNOSIS — G4733 Obstructive sleep apnea (adult) (pediatric): Secondary | ICD-10-CM | POA: Diagnosis not present

## 2022-03-09 DIAGNOSIS — H40011 Open angle with borderline findings, low risk, right eye: Secondary | ICD-10-CM | POA: Diagnosis not present

## 2022-03-09 DIAGNOSIS — H40051 Ocular hypertension, right eye: Secondary | ICD-10-CM | POA: Diagnosis not present

## 2022-03-30 DIAGNOSIS — G4733 Obstructive sleep apnea (adult) (pediatric): Secondary | ICD-10-CM | POA: Diagnosis not present

## 2022-04-06 DIAGNOSIS — F319 Bipolar disorder, unspecified: Secondary | ICD-10-CM | POA: Diagnosis not present

## 2022-04-06 DIAGNOSIS — F1090 Alcohol use, unspecified, uncomplicated: Secondary | ICD-10-CM | POA: Diagnosis not present

## 2022-04-06 DIAGNOSIS — F411 Generalized anxiety disorder: Secondary | ICD-10-CM | POA: Diagnosis not present

## 2022-04-06 DIAGNOSIS — F1290 Cannabis use, unspecified, uncomplicated: Secondary | ICD-10-CM | POA: Diagnosis not present

## 2022-04-30 DIAGNOSIS — G4733 Obstructive sleep apnea (adult) (pediatric): Secondary | ICD-10-CM | POA: Diagnosis not present

## 2022-05-02 DIAGNOSIS — F319 Bipolar disorder, unspecified: Secondary | ICD-10-CM | POA: Diagnosis not present

## 2022-05-02 DIAGNOSIS — F411 Generalized anxiety disorder: Secondary | ICD-10-CM | POA: Diagnosis not present

## 2022-05-02 DIAGNOSIS — F1090 Alcohol use, unspecified, uncomplicated: Secondary | ICD-10-CM | POA: Diagnosis not present

## 2022-05-02 DIAGNOSIS — F1290 Cannabis use, unspecified, uncomplicated: Secondary | ICD-10-CM | POA: Diagnosis not present

## 2022-05-09 DIAGNOSIS — B9689 Other specified bacterial agents as the cause of diseases classified elsewhere: Secondary | ICD-10-CM | POA: Diagnosis not present

## 2022-05-09 DIAGNOSIS — Z6835 Body mass index (BMI) 35.0-35.9, adult: Secondary | ICD-10-CM | POA: Diagnosis not present

## 2022-05-09 DIAGNOSIS — I10 Essential (primary) hypertension: Secondary | ICD-10-CM | POA: Diagnosis not present

## 2022-05-09 DIAGNOSIS — J329 Chronic sinusitis, unspecified: Secondary | ICD-10-CM | POA: Diagnosis not present

## 2022-05-09 DIAGNOSIS — B49 Unspecified mycosis: Secondary | ICD-10-CM | POA: Diagnosis not present

## 2022-05-11 DIAGNOSIS — H40012 Open angle with borderline findings, low risk, left eye: Secondary | ICD-10-CM | POA: Diagnosis not present

## 2022-05-26 DIAGNOSIS — J329 Chronic sinusitis, unspecified: Secondary | ICD-10-CM | POA: Diagnosis not present

## 2022-05-26 DIAGNOSIS — B9689 Other specified bacterial agents as the cause of diseases classified elsewhere: Secondary | ICD-10-CM | POA: Diagnosis not present

## 2022-05-26 DIAGNOSIS — I10 Essential (primary) hypertension: Secondary | ICD-10-CM | POA: Diagnosis not present

## 2022-05-26 DIAGNOSIS — Z6833 Body mass index (BMI) 33.0-33.9, adult: Secondary | ICD-10-CM | POA: Diagnosis not present

## 2022-05-26 DIAGNOSIS — B49 Unspecified mycosis: Secondary | ICD-10-CM | POA: Diagnosis not present

## 2022-05-29 DIAGNOSIS — G4733 Obstructive sleep apnea (adult) (pediatric): Secondary | ICD-10-CM | POA: Diagnosis not present

## 2022-05-29 DIAGNOSIS — F411 Generalized anxiety disorder: Secondary | ICD-10-CM | POA: Diagnosis not present

## 2022-05-29 DIAGNOSIS — F319 Bipolar disorder, unspecified: Secondary | ICD-10-CM | POA: Diagnosis not present

## 2022-05-30 DIAGNOSIS — G4733 Obstructive sleep apnea (adult) (pediatric): Secondary | ICD-10-CM | POA: Diagnosis not present

## 2022-06-19 DIAGNOSIS — H40053 Ocular hypertension, bilateral: Secondary | ICD-10-CM | POA: Diagnosis not present

## 2022-06-19 DIAGNOSIS — H40013 Open angle with borderline findings, low risk, bilateral: Secondary | ICD-10-CM | POA: Diagnosis not present

## 2022-06-20 DIAGNOSIS — E782 Mixed hyperlipidemia: Secondary | ICD-10-CM | POA: Diagnosis not present

## 2022-06-20 DIAGNOSIS — E1169 Type 2 diabetes mellitus with other specified complication: Secondary | ICD-10-CM | POA: Diagnosis not present

## 2022-06-20 DIAGNOSIS — I10 Essential (primary) hypertension: Secondary | ICD-10-CM | POA: Diagnosis not present

## 2022-06-20 DIAGNOSIS — M545 Low back pain, unspecified: Secondary | ICD-10-CM | POA: Diagnosis not present

## 2022-06-20 DIAGNOSIS — M109 Gout, unspecified: Secondary | ICD-10-CM | POA: Diagnosis not present

## 2022-06-22 DIAGNOSIS — K429 Umbilical hernia without obstruction or gangrene: Secondary | ICD-10-CM | POA: Diagnosis not present

## 2022-06-28 DIAGNOSIS — T1502XA Foreign body in cornea, left eye, initial encounter: Secondary | ICD-10-CM | POA: Diagnosis not present

## 2022-06-29 DIAGNOSIS — G4733 Obstructive sleep apnea (adult) (pediatric): Secondary | ICD-10-CM | POA: Diagnosis not present

## 2022-07-01 DIAGNOSIS — M545 Low back pain, unspecified: Secondary | ICD-10-CM | POA: Diagnosis not present

## 2022-07-12 DIAGNOSIS — T1502XD Foreign body in cornea, left eye, subsequent encounter: Secondary | ICD-10-CM | POA: Diagnosis not present

## 2022-07-15 DIAGNOSIS — M545 Low back pain, unspecified: Secondary | ICD-10-CM | POA: Diagnosis not present

## 2022-07-18 DIAGNOSIS — R519 Headache, unspecified: Secondary | ICD-10-CM | POA: Diagnosis not present

## 2022-07-18 DIAGNOSIS — Z122 Encounter for screening for malignant neoplasm of respiratory organs: Secondary | ICD-10-CM | POA: Diagnosis not present

## 2022-07-18 DIAGNOSIS — I10 Essential (primary) hypertension: Secondary | ICD-10-CM | POA: Diagnosis not present

## 2022-07-18 DIAGNOSIS — F3181 Bipolar II disorder: Secondary | ICD-10-CM | POA: Diagnosis not present

## 2022-07-21 ENCOUNTER — Encounter: Payer: Self-pay | Admitting: Adult Health

## 2022-07-21 ENCOUNTER — Ambulatory Visit (INDEPENDENT_AMBULATORY_CARE_PROVIDER_SITE_OTHER): Payer: BC Managed Care – PPO | Admitting: Adult Health

## 2022-07-21 VITALS — BP 140/69 | HR 54 | Wt 202.0 lb

## 2022-07-21 DIAGNOSIS — F319 Bipolar disorder, unspecified: Secondary | ICD-10-CM | POA: Diagnosis not present

## 2022-07-21 DIAGNOSIS — F411 Generalized anxiety disorder: Secondary | ICD-10-CM | POA: Diagnosis not present

## 2022-07-21 DIAGNOSIS — G4700 Insomnia, unspecified: Secondary | ICD-10-CM

## 2022-07-21 NOTE — Progress Notes (Signed)
[facility] MD/PA/NP Initial Note  07/21/2022 4:00 PM Andrew Glover  MRN:  [id number]  Chief Complaint:   HPI:  Patient seen today for initial psychiatric evaluation.   Last seen for psychiatry 05/29/2022 - Dr. Erling Cruz with [facility].   Collateral reviewed.  Describes mood today as "ok". Pleasant. Denies tearfulness. Mood symptoms - denies depression and anxiety. Reports irritability - more "situational - things can set me off". Reports worry, rumination, and over thinking. Denies obsessive thoughts and acts. Mood is variable - denies highs and lows. Stating "I feel like I'm doing good". Stopped alcohol and marijuana 3 months ago. Stating "I just decided to stop and that was that". Feels like his mood has improved - "feels better physically". Currently taking Tripeltal 368m twice daily and is experiencing a headache - "constant - more irritating" Feels like the Trileptal has been helpful for mood symptoms but is unable to tolerate with the side effects. Is willing to continue Lamictal for mood stabilization. Stable interest and motivation. Taking medications as prescribed.  Energy levels good. Active, does not have a regular exercise routine.  Enjoys some usual interests and activities. Married. Lives with wife. Has 3 step children. Spending time with family and friends. Appetite adequate. Weight loss - 32 pounds. Sleeps well most nights - gets up early. Averages 7 hours. Focus and concentration stable. Completing tasks. Managing aspects of household. Works at [employer] x 44 years - retiring in January 2025.  Denies SI or HI.  Denies AH or VH. Denies self harm. Stopped THC and Alcohol 3 months ago.  Previous medication trials: Abilify, Lamictal, Latuda, Lithium, Rexulti, Trileptal, Geodon  Visit Diagnosis:    ICD-10-CM   1. Bipolar I disorder ([place]  F31.9     2. Generalized anxiety disorder  F41.1     3. Insomnia, unspecified type  G47.00       Past Psychiatric History: Denies  psychiatric hospitalization.   Past Medical History:  Past Medical History:  Diagnosis Date   Bipolar 1 disorder ([place]    Hyperlipidemia    Hypertension    No past surgical history on file.  Family Psychiatric History: Family history of mental illness - mother with Bipolar disorder.   Family History: No family history on file.  Social History:  Social History   Socioeconomic History   Marital status: Married    Spouse name: Not on file   Number of children: Not on file   Years of education: Not on file   Highest education level: Not on file  Occupational History   Not on file  Tobacco Use   Smoking status: Never   Smokeless tobacco: Never  Vaping Use   Vaping Use: Never used  Substance and Sexual Activity   Alcohol use: Yes    Alcohol/week: 42.0 standard drinks of alcohol    Types: 42 Cans of beer per week    Comment: several beers daily   Drug use: Yes    Types: Marijuana   Sexual activity: Yes  Other Topics Concern   Not on file  Social History Narrative   Not on file   Social Determinants of Health   Financial Resource Strain: Not on file  Food Insecurity: Not on file  Transportation Needs: Not on file  Physical Activity: Not on file  Stress: Not on file  Social Connections: Not on file    Allergies:  Allergies  Allergen Reactions   Aripiprazole     Other Reaction(s): akathisias, weight gain on 5 mg  daily (43 lbs over 6 yrs.Only 6 lbs and no akathisias over 2 years.   Brexpiprazole     Other Reaction(s): itchy ankles, not effective on 1.5 mg qd   Lamotrigine     Other Reaction(s): no SE, not effective   Lithium     Other Reaction(s): no SE, not effective   Lurasidone     Other Reaction(s): increased appetite, not as "laid back:   Quetiapine     Other Reaction(s): sleepy, increased weight, not effective   Sulfa Antibiotics Hives and Itching   Ziprasidone     Other Reaction(s): drowsiness on 40 mg twice daily   Codeine Itching and Rash    Sulfamethoxazole Rash   Sulfamethoxazole-Trimethoprim Rash    Metabolic Disorder Labs: Lab Results  Component Value Date   HGBA1C 6.1 03/31/2021   No results found for: "PROLACTIN" Lab Results  Component Value Date   CHOL 173 03/31/2021   TRIG 211.0 (H) 03/31/2021   HDL 43.40 03/31/2021   CHOLHDL 4 03/31/2021   VLDL 42.2 (H) 03/31/2021   Lab Results  Component Value Date   TSH 3.44 03/31/2021    Therapeutic Level Labs: No results found for: "LITHIUM" No results found for: "VALPROATE" No results found for: "CBMZ"  Current Medications: Current Outpatient Medications  Medication Sig Dispense Refill   ARIPiprazole (ABILIFY) 5 MG tablet Take 0.5 tablets (2.5 mg total) by mouth daily. 15 tablet 1   ascorbic acid (VITAMIN C) 1000 MG tablet Take by mouth.     aspirin 81 MG EC tablet Take 1 tablet by mouth every morning.     atenolol-chlorthalidone (TENORETIC) 50-25 MG tablet Take 1 tablet by mouth daily. 30 tablet 1   atorvastatin (LIPITOR) 40 MG tablet Take 1 tablet (40 mg total) by mouth daily. Take 40 mg by mouth daily. 30 tablet 1   cetirizine (ZYRTEC) 10 MG tablet Take 10 mg by mouth daily.     ergocalciferol (VITAMIN D2) 1.25 MG (50000 UT) capsule Take by mouth.     esomeprazole (NEXIUM) 40 MG capsule TAKE 1 CAPSULE (40 MG TOTAL) BY MOUTH DAILY. 90 capsule 2   KLOR-CON M20 20 MEQ tablet TAKE 1 TABLET BY MOUTH EVERY DAY 90 tablet 1   losartan (COZAAR) 100 MG tablet Take 1 tablet (100 mg total) by mouth daily. 30 tablet 1   triamcinolone cream (KENALOG) 0.1 % Apply 1 application topically 2 (two) times daily. 30 g 0   ziprasidone (GEODON) 20 MG capsule TAKE ONE CAPSULE BY MOUTH TWICE A DAY WITH MEALS 60 capsule 1   No current facility-administered medications for this visit.    Medication Side Effects: none  Orders placed this visit:  No orders of the defined types were placed in this encounter.   Psychiatric Specialty Exam:  Review of Systems  Musculoskeletal:   Negative for gait problem.  Neurological:  Negative for tremors.  Psychiatric/Behavioral:         Please refer to HPI    Blood pressure (!) 140/69, pulse (!) 54, weight 202 lb (91.6 kg).Body mass index is 32.6 kg/m.  General Appearance:   Eye Contact:  Good  Speech:  Clear and Coherent and Normal Rate  Volume:  Normal  Mood:  Euthymic  Affect:  Appropriate and Congruent  Thought Process:  Coherent  Orientation:  Full (Time, Place, and Person)  Thought Content: Logical   Suicidal Thoughts:  No  Homicidal Thoughts:  No  Memory:  WNL  Judgement:  Good  Insight:  Good  Psychomotor Activity:  Normal  Concentration:  Concentration: Good and Attention Span: Good  Recall:  Good  Fund of Knowledge: Good  Language: Good  Assets:  Communication Skills Desire for Improvement Financial Resources/Insurance Housing Intimacy Leisure Time Physical Health Resilience Social Support Talents/Skills Transportation Vocational/Educational  ADL's:  Intact  Cognition: WNL  Prognosis:  Good   Screenings:  GAD-7    Flowsheet Row Office Visit from 09/20/2021 in [place] at [facility] Visit from 12/16/2020 in [place] at [facility]  Total GAD-7 Score 1 10      PHQ2-9    [place] Visit from 09/20/2021 in [place] at [facility] Visit from 03/18/2021 in [place] at [facility] Visit from 12/16/2020 in [place] at [facility]  PHQ-2 Total Score 0 0 0  PHQ-9 Total Score 0 -- 10       Receiving Psychotherapy: No   Treatment Plan/Recommendations:  Plan:  PDMP reviewed  Lamictal 112m BID  Oxcarbazepine 3031mBID - discussed taper off medication - 450x 7, 300 x 7, then 150  7, then d/c - headaches.  RTC 4 weeks  Patient advised to contact office with any questions, adverse effects, or acute worsening in signs and  symptoms.  Counseled patient regarding potential benefits, risks, and side effects of Lamictal to include potential risk of Stevens-Johnson syndrome. Advised patient to stop taking Lamictal and contact office immediately if rash develops and to seek urgent medical attention if rash is severe and/or spreading quickly.       ReAloha GellNP

## 2022-07-24 DIAGNOSIS — M545 Low back pain, unspecified: Secondary | ICD-10-CM | POA: Diagnosis not present

## 2022-07-30 DIAGNOSIS — G4733 Obstructive sleep apnea (adult) (pediatric): Secondary | ICD-10-CM | POA: Diagnosis not present

## 2022-07-31 ENCOUNTER — Ambulatory Visit (INDEPENDENT_AMBULATORY_CARE_PROVIDER_SITE_OTHER): Payer: BC Managed Care – PPO | Admitting: Psychiatry

## 2022-07-31 DIAGNOSIS — F319 Bipolar disorder, unspecified: Secondary | ICD-10-CM

## 2022-07-31 NOTE — Progress Notes (Signed)
[facility] Counselor Initial Adult Exam  Name: Andrew Glover Date: 07/31/2022 MRN: [id number] DOB: Jun 06, 1961 PCP: Antony Contras, MD  Time spent: 60 minutes  Guardian/Payee:  patient    Paperwork requested:  No   Reason for Visit /Presenting Problem: "bipolar, anxiety, depression, irritable". 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 States anxiety is main symptom. Anger also an issue "especially when something or somebody ticks me off".  Mental Status Exam:    Appearance:   Casual     Behavior:  Appropriate, Sharing, and Motivated  Motor:  Normal  Speech/Language:   Clear and Coherent  Affect:  Anxious, depressed, irritable  Mood:  anxious, depressed, and irritable  Thought process:  goal directed  Thought content:    Rumination  Sensory/Perceptual disturbances:    WNL  Orientation:  oriented to person, place, time/date, situation, day of week, month of year, year, and stated date of Feb. 18, 2024  Attention:  Fair  Concentration:  Fair  Memory:  [place] of knowledge:   Good  Insight:    Good and Fair  Judgment:   Good  Impulse Control:  Fair   Reported Symptoms:  See symptoms above  Risk Assessment: Danger to Self:  No Self-injurious Behavior: No Danger to Others: No Duty to Warn:no Physical Aggression / Violence:No  Access to Firearms a concern: No  Gang Involvement:No  Patient / guardian was educated about steps to take if suicide or homicide risk level increases between visits: denies any SI. While future psychiatric events cannot be accurately predicted, the patient does not currently require acute inpatient psychiatric care and does not currently meet [facility] involuntary commitment criteria.  Substance Abuse History: Current substance abuse:  Denies any Substance abuse. States he stopped drinking and smoking pot 2 1/2 months ago and has stayed stopped. Has lost some weight "which is good."       Past Psychiatric History:   Previous psychological history is significant for anxiety, depression, and Bipolar with  Deloria Lair, DNP Outpatient Providers:Regina [place], DNP for med management History of Psych Hospitalization: No  Psychological Testing:  n/a    Abuse History: Victim of No.,  denies any abuse    Report needed: No. Victim of Neglect:No. Perpetrator of  no   Witness / Exposure to Domestic Violence: No   Protective Services Involvement: No  Witness to [employer] Violence:  No   Family History: Lives with wife of 37 yrs. No children but wife had 3 adult kids from prior marriage and patient reports he and wife remain close to her kids, but some issues with grandson age 66 and very concerned and in some cases, angry about behaviors that are allowed  from grandson. Patient very upset over this child's behavior.   Living situation: the patient lives with his spouse.   Sexual Orientation:  Straight  Relationship Status: married  Name of spouse / other:name not given             If a parent, number of children / ages:"wife has 3 adult kids"  Support Systems; friends  Financial Stress:  Yes   Income/Employment/Disability: Employment, Wife gets Scientist, research (life sciences): No   Educational History: Education: high school diploma/GED  Religion/Sprituality/World View:   Protestant  Any cultural differences that may affect / interfere with treatment:  not applicable   Recreation/Hobbies: TV, travel  Stressors:Financial difficulties   Health problems   Marital or  family conflict    Strengths:  Supportive Relationships, Family, Friends, Conservator, museum/gallery, and Able to Communicate Effectively  Barriers:  "wife and frustrations at home"   Legal History: Pending legal issue / charges: The patient has no significant history of legal issues. History of legal issue / charges:  n/a  Medical History/Surgical History:Reviewed with patient and he confirms info below. Past  Medical History:  Diagnosis Date   Bipolar 1 disorder ([place])    Hyperlipidemia    Hypertension     No past surgical history on file.  Patient today reports having sinus surgery about 15 yrs ago.   Medications: Patient confirms info. Current Outpatient Medications  Medication Sig Dispense Refill   ARIPiprazole (ABILIFY) 5 MG tablet Take 0.5 tablets (2.5 mg total) by mouth daily. 15 tablet 1   ascorbic acid (VITAMIN C) 1000 MG tablet Take by mouth.     aspirin 81 MG EC tablet Take 1 tablet by mouth every morning.     atenolol-chlorthalidone (TENORETIC) 50-25 MG tablet Take 1 tablet by mouth daily. 30 tablet 1   atorvastatin (LIPITOR) 40 MG tablet Take 1 tablet (40 mg total) by mouth daily. Take 40 mg by mouth daily. 30 tablet 1   cetirizine (ZYRTEC) 10 MG tablet Take 10 mg by mouth daily.     ergocalciferol (VITAMIN D2) 1.25 MG (50000 UT) capsule Take by mouth.     esomeprazole (NEXIUM) 40 MG capsule TAKE 1 CAPSULE (40 MG TOTAL) BY MOUTH DAILY. 90 capsule 2   KLOR-CON M20 20 MEQ tablet TAKE 1 TABLET BY MOUTH EVERY DAY 90 tablet 1   losartan (COZAAR) 100 MG tablet Take 1 tablet (100 mg total) by mouth daily. 30 tablet 1   triamcinolone cream (KENALOG) 0.1 % Apply 1 application topically 2 (two) times daily. 30 g 0   ziprasidone (GEODON) 20 MG capsule TAKE ONE CAPSULE BY MOUTH TWICE A DAY WITH MEALS 60 capsule 1   No current facility-administered medications for this visit.    Allergies  Allergen Reactions   Aripiprazole     Other Reaction(s): akathisias, weight gain on 5 mg daily (43 lbs over 6 yrs.Only 6 lbs and no akathisias over 2 years.   Brexpiprazole     Other Reaction(s): itchy ankles, not effective on 1.5 mg qd   Lamotrigine     Other Reaction(s): no SE, not effective   Lithium     Other Reaction(s): no SE, not effective   Lurasidone     Other Reaction(s): increased appetite, not as "laid back:   Quetiapine     Other Reaction(s): sleepy, increased weight, not effective    Sulfa Antibiotics Hives and Itching   Ziprasidone     Other Reaction(s): drowsiness on 40 mg twice daily   Codeine Itching and Rash   Sulfamethoxazole Rash   Sulfamethoxazole-Trimethoprim Rash    Diagnoses:    ICD-10-CM   1. Bipolar I disorder ([place])  F31.9      Treatment goal plan of care:  Patient not signing treatment goal plan on computer screen due to continued concerns with COVID.  Treatment goals: Treatment goals remain on treatment plan as patient works with strategies to achieve his goals.  Progress is noted each session and documented with patient and included in the progress/plan section of treatment note.  Reduce overall level, frequency, and intensity of the anxiety so that daily functioning is not impaired. Increase understanding of beliefs and messages that produce the worry and anxiety. Identify and use specific doping  strategies for anxiety reduction.    Plan of Care:  Today is patient's first session with this therapist and together we completed his initial evaluation for therapy and his initial treatment goal plan.  Andrew Glover is a 62 year old, married male for 26 yrs after dating 8 1/2 yrs. This is wife's 2nd marriage and she had 3 (2 females and 1 male) adult kids who patient reports that "I get along with them usually." Has 5 grandchildren "all between ages of 56 and 47" and patient/wife has contact with all of them. Reports he and wife disagree on "lots of things". Reports being angry growing up, things sometimes set me off and I have low level of frustration tolerance." Adds that he does sometimes step back when angry and think and chills out before responding, but not always." Denies ever harming anyone while angry or otherwise, burt admits he has "thought about it" but had self control. Reports sleeping well. Denies any alcohol consumption after having quit drinking and smoking pot 2 1/2 months ago. States "I came here because I feel like if I talk with someone I'll  feel better." Adds "if you tell me I'm wrong then I want to fix it". Reports that sometimes he and wife conflict and other times they get along ok. Whenever there is disagreement especially with her side of family, there is conflict (verbal, not physical) verbally, never physically abusive but does admit to being verbally abusive when upset/frustrated. Admits to not managing triggers very well, and wants to manage this better. States by coming to therapy he wants to work on his relationship within family, manage anger better, manage anxiety and stress better, be able to control his responses and behavior better in adverse situations.  For further information on this patient including family history, personal history, risk assessment, and medical history, please see the above sections of this full initial evaluation.  Review of initial treatment goal plan and patient is in agreement.  Next appointment within 2 weeks.  This record has been created using [employer].  Chart creation errors have been sought, but may not always have been located and corrected.  Such creation errors do not reflect on the standard of medical care provided.   Shanon Ace, LCSW

## 2022-08-02 ENCOUNTER — Other Ambulatory Visit: Payer: Self-pay | Admitting: Family Medicine

## 2022-08-02 DIAGNOSIS — Z122 Encounter for screening for malignant neoplasm of respiratory organs: Secondary | ICD-10-CM

## 2022-08-14 ENCOUNTER — Ambulatory Visit (INDEPENDENT_AMBULATORY_CARE_PROVIDER_SITE_OTHER): Payer: BC Managed Care – PPO | Admitting: Psychiatry

## 2022-08-14 DIAGNOSIS — R431 Parosmia: Secondary | ICD-10-CM | POA: Diagnosis not present

## 2022-08-14 DIAGNOSIS — R200 Anesthesia of skin: Secondary | ICD-10-CM | POA: Diagnosis not present

## 2022-08-14 DIAGNOSIS — F319 Bipolar disorder, unspecified: Secondary | ICD-10-CM | POA: Diagnosis not present

## 2022-08-14 DIAGNOSIS — B356 Tinea cruris: Secondary | ICD-10-CM | POA: Diagnosis not present

## 2022-08-14 NOTE — Progress Notes (Signed)
[facility] Counselor/Therapist Progress Note  Patient ID: Andrew Glover, MRN: [id number],    Date: 08/14/2022  Time Spent: 55 minutes   Treatment Type: Individual Therapy  Reported Symptoms: anxious, depressed, frustrated, some anger  Mental Status Exam:  Appearance:   Casual     Behavior:  Appropriate, Sharing, and Motivated  Motor:  Normal  Speech/Language:   Clear and Coherent  Affect:  Depressed and anxious  Mood:  anxious and depressed  Thought process:  goal directed  Thought content:    Rumination  Sensory/Perceptual disturbances:    WNL  Orientation:  oriented to person, place, time/date, situation, day of week, month of year, year, and stated date of August 14, 2022  Attention:  Good  Concentration:  Good  Memory:  WNL  Fund of knowledge:   Good  Insight:    Good and Fair  Judgment:   Good  Impulse Control:  Good and Fair   Risk Assessment: Danger to Self:  No Self-injurious Behavior: No Danger to Others: No Duty to Warn:no Physical Aggression / Violence:No  Access to Firearms a concern: No  Gang Involvement:No   Subjective:   Patient in today reporting anxiety and some depression related personal, family issues. Communication issues are "huge" between he and wife. States he grew up with "[place] and wife did not and it shows when we try to talk." Reports lots of "disrespect in the household". Reports being "very upset over the way wife lets grandchildren & parents behave disrespectfully and talk. [place] Worked today on his difficulties coping with situations at home. Feels wife nor adult step-daughter have any respect for him. Frustrated, hard to stay focused on his frustrations and ways of better managing them. Processes how his attitude got worse when he quit drinking as his drinking had been self-medicated his anger. Focusing more on faults of others. Encouraged some re-direction at times to focus more on  himself. Looked at what he can control versus what he cannot control and he was able to respond in a more positive way. Discussed ways he could better manage anxiety and some anger and he agreed to work on this between sessions. Also to pay closer attention to focusing on himself and needed changes versus changes he feels others need to make. Discussed activities including walking the dog that brings him "peace". Working to reduce overall level, frequency, and intensity of his anxiety and anger so that his daily functioning is not impaired. Trying to help patient identify coping strategies that he is willing to try with his anger and anxiety.   Interventions: Cognitive Behavioral Therapy and Ego-Supportive  Treatment goals remain on treatment plan as patient works with strategies to achieve his goals.  Progress is noted each session and documented with patient and included in the progress/plan section of treatment note.   Reduce overall level, frequency, and intensity of the anxiety so that daily functioning is not impaired. Increase understanding of beliefs and messages that produce the worry and anxiety. Identify and use specific doping strategies for anxiety reduction.    Diagnosis:   ICD-10-CM   1. Bipolar I disorder ([place])  F31.9      Plan:  Patient today showing good participation and motivation in session working on relationships within family, coping skills, and reduction of anger and anxiety. Has made some progress in his openness in talking and does seem motivated to follow through in working on  goal directed behaviors to stop unhealthy communication and behavioral patterns and move forward in a healthier direction. Encouraged patient in practicing more positive/self affirming behaviors including: Getting outside daily and walking, staying in touch with supportive people, practice "the pause" as needed, staying in the present focusing on what he can control or change, recognize and emphasize  his healthier strengths, refrain from assuming worst-case scenarios, healthy nutrition and exercise, practice consistent positive self talk, using "timeouts" as needed, refrain from self negating, reduce overthinking and over analyzing, letting go of things from the past that can hold him back now, and recognize the strength he shows working with goal-directed behaviors to move in a direction that supports his improved emotional health and overall wellbeing.  Goal review and progress/challenges noted with patient.  Next appointment within 2 weeks.  This record has been created using [employer].  Chart creation errors have been sought, but may not always have been located and corrected.  Such creation errors do not reflect on the standard of medical care provided.   Shanon Ace, LCSW

## 2022-08-21 ENCOUNTER — Ambulatory Visit (INDEPENDENT_AMBULATORY_CARE_PROVIDER_SITE_OTHER): Payer: BC Managed Care – PPO | Admitting: Adult Health

## 2022-08-21 ENCOUNTER — Encounter: Payer: Self-pay | Admitting: Adult Health

## 2022-08-21 DIAGNOSIS — F411 Generalized anxiety disorder: Secondary | ICD-10-CM | POA: Diagnosis not present

## 2022-08-21 DIAGNOSIS — F319 Bipolar disorder, unspecified: Secondary | ICD-10-CM | POA: Diagnosis not present

## 2022-08-21 DIAGNOSIS — G4700 Insomnia, unspecified: Secondary | ICD-10-CM | POA: Diagnosis not present

## 2022-08-21 MED ORDER — LAMOTRIGINE 100 MG PO TABS: 100.0000 mg | ORAL_TABLET | Freq: Two times a day (BID) | ORAL | 5 refills | Status: DC

## 2022-08-21 NOTE — Progress Notes (Unsigned)
[facility]  PATIENT: Andrew Glover DOB: [birth year]  REFERRING DOCTOR OR PCP: Andrew Contras, MD SOURCE: Patient, notes from primary care, and imaging reports, MRI images personally reviewed.  _________________________________   HISTORICAL  CHIEF COMPLAINT:  Chief Complaint  Patient presents with   New Patient (Initial Visit)    RM 10, alone. Paper referral for bilateral lower extremity numbness.      HISTORY OF PRESENT ILLNESS:  I had the pleasure of seeing your patient, Andrew Glover, at [facility] for neurologic consultation regarding his  leg numbness  Andrew Glover is a 62 year old man who had the onset of right thigh numbness in early 2023.  This started as pure numbness then tingling and pain and then tingling and dysesthesia.  He had severe allodynia, especially with the sheet rubbing over the thigh.  He had a lot of pruritus.   Currently, the pain and pruritus and allodynia are much better but the numbness is still present   Symptoms in the left leg started 2 months after the right leg.  Symptoms were similar though milder.    Of note, he started lamotrigine around August or September 2023 and the improvement may have been concurrent.     He has had altered smell since mid 2023.    He had Covid twice but tthe onset of the altered smell was not temporally related.   He used to drink heavily since age 73 and quit last year.  He was able to lose 32 pounds since quitting -- this occurred after the onset of symptoms.     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  Hs worse HgbA1c was reportedly 7.2    With weight loss this improved and he has never been on any medications.     He also has degenerative disc changes.  Recent MRI shows that this was most pronounced at L4-L5 where there is lateral recess stenosis that could affect the right L5 nerve root.  Imaging: MRI of the lumbar spine 07/15/2022 was reviewed.  There is mild disc bulging at  L1-L2 but no spinal stenosis or nerve root compression.  There is disc bulging and mild facet hypertrophy at L2-L3 and L3-L4.  There is right paramedian disc protrusion, 1 mm anterolisthesis and right greater than left facet hypertrophy at L4-L5.  There is lateral recess stenosis towards the right that could affect the right L5 nerve root.  At L5-S1, there is minimal disc bulging and moderate right facet hypertrophy but no spinal stenosis or nerve root compression.  REVIEW OF SYSTEMS: Constitutional: No fevers, chills, sweats, or change in appetite Eyes: No visual changes, double vision, eye pain Ear, nose and throat: No hearing loss, ear pain, nasal congestion, sore throat Cardiovascular: No chest pain, palpitations Respiratory:  No shortness of breath at rest or with exertion.   No wheezes GastrointestinaI: No nausea, vomiting, diarrhea, abdominal pain, fecal incontinence Genitourinary:  No dysuria, urinary retention or frequency.  No nocturia. Musculoskeletal: He has a long history of back pain  integumentary: No rash, pruritus, skin lesions Neurological: as above Psychiatric: No depression.  He has a diagnosis of bipolar disease.  He has a history of alcohol abuse  endocrine: No palpitations, diaphoresis, change in appetite, change in weigh or increased thirst Hematologic/Lymphatic:  No anemia, purpura, petechiae. Allergic/Immunologic: No itchy/runny eyes, nasal congestion, recent allergic reactions, rashes  ALLERGIES: Allergies  Allergen Reactions   Aripiprazole     Other Reaction(s): akathisias, weight gain on  5 mg daily (43 lbs over 6 yrs.Only 6 lbs and no akathisias over 2 years.   Brexpiprazole     Other Reaction(s): itchy ankles, not effective on 1.5 mg qd   Lamotrigine     Other Reaction(s): no SE, not effective   Lithium     Other Reaction(s): no SE, not effective   Lurasidone     Other Reaction(s): increased appetite, not as "laid back:   Quetiapine     Other Reaction(s):  sleepy, increased weight, not effective   Sulfa Antibiotics Hives and Itching   Ziprasidone     Other Reaction(s): drowsiness on 40 mg twice daily   Codeine Itching and Rash   Sulfamethoxazole Rash   Sulfamethoxazole-Trimethoprim Rash    HOME MEDICATIONS:  Current Outpatient Medications:    ascorbic acid (VITAMIN C) 1000 MG tablet, Take 1,000 mg by mouth daily., Disp: , Rfl:    aspirin 81 MG EC tablet, Take 1 tablet by mouth every morning., Disp: , Rfl:    atenolol-chlorthalidone (TENORETIC) 50-25 MG tablet, Take 1 tablet by mouth daily., Disp: 30 tablet, Rfl: 1   atorvastatin (LIPITOR) 40 MG tablet, Take 1 tablet (40 mg total) by mouth daily. Take 40 mg by mouth daily., Disp: 30 tablet, Rfl: 1   cetirizine (ZYRTEC) 10 MG tablet, Take 10 mg by mouth daily., Disp: , Rfl:    clobetasol cream (TEMOVATE) [id number] %, Apply 1 Application topically as needed., Disp: , Rfl:    ergocalciferol (VITAMIN D2) 1.25 MG (50000 UT) capsule, Take by mouth., Disp: , Rfl:    lamoTRIgine (LAMICTAL) 100 MG tablet, Take 1 tablet (100 mg total) by mouth 2 (two) times daily., Disp: 60 tablet, Rfl: 5   losartan (COZAAR) 100 MG tablet, Take 1 tablet (100 mg total) by mouth daily., Disp: 30 tablet, Rfl: 1   Multiple Vitamin (MULTIVITAMIN PO), Take 1 tablet by mouth daily., Disp: , Rfl:    triamcinolone cream (KENALOG) 0.1 %, Apply 1 application topically 2 (two) times daily. (Patient taking differently: Apply 1 application  topically as needed.), Disp: 30 g, Rfl: 0  PAST MEDICAL HISTORY: Past Medical History:  Diagnosis Date   Bipolar 1 disorder ([place])    Hyperlipidemia    Hypertension     PAST SURGICAL HISTORY: Past Surgical History:  Procedure Laterality Date   NASAL SINUS SURGERY Bilateral     FAMILY HISTORY: Family History  Problem Relation Age of Onset   Stroke Mother    Rheumatic fever Mother    Heart Problems Mother    High blood pressure Father    High Cholesterol Father    Diabetes Father     Alzheimer's disease Father    Multiple myeloma Father     SOCIAL HISTORY: Social History   Socioeconomic History   Marital status: Married    Spouse name: Not on file   Number of children: Not on file   Years of education: Not on file   Highest education level: Not on file  Occupational History   Not on file  Tobacco Use   Smoking status: Former    Types: Cigarettes   Smokeless tobacco: Never  Vaping Use   Vaping Use: Never used  Substance and Sexual Activity   Alcohol use: Not Currently    Alcohol/week: 42.0 standard drinks of alcohol    Types: 42 Cans of beer per week   Drug use: Not Currently    Types: Marijuana   Sexual activity: Yes  Other Topics Concern  Not on file  Social History Narrative   Right handed   Caffeine use: 1 cup coffee per day   Social Determinants of Health   Financial Resource Strain: Not on file  Food Insecurity: Not on file  Transportation Needs: Not on file  Physical Activity: Not on file  Stress: Not on file  Social Connections: Not on file  Intimate Partner Violence: Not on file       PHYSICAL EXAM  Vitals:   08/22/22 1352  BP: 128/69  Pulse: (!) 59  Weight: 203 lb 9.6 oz (92.4 kg)  Height: '5\' 6"'$  (1.676 m)    Body mass index is 32.86 kg/m.   General: The patient is well-developed and well-nourished and in no acute distress  HEENT:  Head is [place]/AT.  Sclera are anicteric.  Funduscopic exam shows normal optic discs and retinal vessels.  Neck: No carotid bruits are noted.  The neck is nontender.  Cardiovascular: The heart has a regular rate and rhythm with a normal S1 and S2. There were no murmurs, gallops or rubs.    Skin: Extremities are without rash or  edema.  Musculoskeletal:  Back is nontender  Neurologic Exam  Mental status: The patient is alert and oriented x 3 at the time of the examination. The patient has apparent normal recent and remote memory, with an apparently normal attention span and concentration  ability.   Speech is normal.  Cranial nerves: Extraocular movements are full. Pupils are equal, round, and reactive to light and accomodation.  Visual fields are full.  Facial symmetry is present. There is good facial sensation to soft touch bilaterally.Facial strength is normal.  Trapezius and sternocleidomastoid strength is normal. No dysarthria is noted.  The tongue is midline, and the patient has symmetric elevation of the soft palate. No obvious hearing deficits are noted.  Motor:  Muscle bulk is normal.   Tone is normal. Strength is  5 / 5 in all 4 extremities.   Sensory: Sensory testing is intact to pinprick, soft touch and vibration sensation in the arms.  He has reduced sensation in the anterolateral thighs bilaterally in the distribution of the lateral femoral cutaneous nerve.  There is no allodynia.  He has normal sensation in the toes/feet.  Coordination: Cerebellar testing reveals good finger-nose-finger and heel-to-shin bilaterally.  Gait and station: Station is normal.   Gait is arthritic. Tandem gait is mildly wide. Romberg is negative.   Reflexes: Deep tendon reflexes are symmetric and normal bilaterally.   Plantar responses are flexor.    DIAGNOSTIC DATA (LABS, IMAGING, TESTING) - I reviewed patient records, labs, notes, testing and imaging myself where available.  Lab Results  Component Value Date   WBC 9.2 03/31/2021   HGB 15.9 03/31/2021   HCT 45.8 03/31/2021   MCV 90.7 03/31/2021   PLT 193.0 03/31/2021      Component Value Date/Time   NA 139 03/31/2021 1321   K 3.6 03/31/2021 1321   CL 98 03/31/2021 1321   CO2 31 03/31/2021 1321   GLUCOSE 110 (H) 03/31/2021 1321   BUN 11 03/31/2021 1321   CREATININE 1.04 03/31/2021 1321   CALCIUM 9.4 03/31/2021 1321   PROT 6.5 03/31/2021 1321   ALBUMIN 4.5 03/31/2021 1321   AST 22 03/31/2021 1321   ALT 33 03/31/2021 1321   ALKPHOS 75 03/31/2021 1321   BILITOT 1.0 03/31/2021 1321   GFRNONAA >90 03/20/2011 1955   GFRAA  >90 03/20/2011 1955   Lab Results  Component Value Date  CHOL 173 03/31/2021   HDL 43.40 03/31/2021   LDLDIRECT 111.0 03/31/2021   TRIG 211.0 (H) 03/31/2021   CHOLHDL 4 03/31/2021   Lab Results  Component Value Date   HGBA1C 6.1 03/31/2021   No results found for: "VITAMINB12" Lab Results  Component Value Date   TSH 3.44 03/31/2021       ASSESSMENT AND PLAN  Lateral cutaneous nerve of thigh syndrome, right  Lateral femoral cutaneous entrapment syndrome, left  Dysesthesia  Bipolar 2 disorder (HCC)  Lumbar degenerative disc disease  In summary, Mr. Wyly is a 62 year old man who developed right anterolateral numbness followed by dysesthesias about 1 year ago.  This was followed a month or 2 later by similar but milder symptoms on the left.  On examination today, he has numbness in the distribution of the lateral femoral cutaneous nerve bilaterally.  Of note, bipolar medications were changed last year and lamotrigine was added.  Symptoms began to improve around that time.  I discussed with him that we often use lamotrigine for neuropathic pain though psychiatrist use it mostly for bipolar disease.  It is probable that adding that medicine helped.  Since symptoms are better and he had no other neurologic issue on today's exam, he is advised to continue the lamotrigine.  He will return as needed if there are new or worsening neurologic symptoms.  Thank you for asking me to see this patient.  Please let me know if I can be of further assistance with him or other patients in the future.  Ulani Degrasse A. Felecia Shelling, MD, [facility] [id number], [phone] PM Certified in Neurology, Clinical Neurophysiology, Sleep Medicine and Neuroimaging  [facility] [address], [place], [place] [address] [phone]

## 2022-08-21 NOTE — Progress Notes (Signed)
FREE DIOP [id number] 1961-01-02 62 y.o.  Subjective:   Patient ID:  Andrew Glover is a 62 y.o. (DOB Aug 09, 1960) male.  Chief Complaint: No chief complaint on file.   HPI Andrew Glover presents to the office today for follow-up of BPD-1, GAD, and insomnia.  Describes mood today as "ok". Pleasant. Denies tearfulness. Mood symptoms - denies depression and anxiety. Reports irritability at times - more "situational". Reports decreased worry, rumination, and over thinking. Denies obsessive thoughts and acts. Mood is more consistent. Feel like mood has improved has improved since stopping alcohol and THC - 6 months ago. Stating "I feel like I'm doing good". Feels like Lamictal continues to work well. Has stopped Trileptal and headaches have ceased. Stable interest and motivation. Taking medications as prescribed.  Energy levels good. Active, does not have a regular exercise routine.  Enjoys some usual interests and activities. Married. Lives with wife. Has 3 step children. Spending time with family and friends. Appetite adequate. Weight 30 pounds. Sleeps well most nights. Averages 7 hours. Focus and concentration stable. Completing tasks. Managing aspects of household. Works at [employer] x 44 years - retiring in January 2025.  Denies SI or HI.  Denies AH or VH. Denies self harm. Stopped THC and Alcohol x 6 months ago.  Previous medication trials: Abilify, Lamictal, Latuda, Lithium, Rexulti, Trileptal, Geodon   GAD-7    Flowsheet Row Office Visit from 09/20/2021 in [place] at [facility] Visit from 12/16/2020 in [place] at [facility]  Total GAD-7 Score 1 10      PHQ2-9    [place] Visit from 09/20/2021 in [place] at [facility] Visit from 03/18/2021 in [place] at [facility] Visit from 12/16/2020 in [place] at [facility]  PHQ-2 Total Score 0 0 0  PHQ-9 Total Score 0 -- 10        Review of Systems:  Review of Systems  Musculoskeletal:  Negative for gait problem.  Neurological:  Negative for tremors.  Psychiatric/Behavioral:         Please refer to HPI    Medications: I have reviewed the patient's current medications.  Current Outpatient Medications  Medication Sig Dispense Refill   ascorbic acid (VITAMIN C) 1000 MG tablet Take by mouth.     aspirin 81 MG EC tablet Take 1 tablet by mouth every morning.     atenolol-chlorthalidone (TENORETIC) 50-25 MG tablet Take 1 tablet by mouth daily. 30 tablet 1   atorvastatin (LIPITOR) 40 MG tablet Take 1 tablet (40 mg total) by mouth daily. Take 40 mg by mouth daily. 30 tablet 1   cetirizine (ZYRTEC) 10 MG tablet Take 10 mg by mouth daily.     ergocalciferol (VITAMIN D2) 1.25 MG (50000 UT) capsule Take by mouth.     esomeprazole (NEXIUM) 40 MG capsule TAKE 1 CAPSULE (40 MG TOTAL) BY MOUTH DAILY. 90 capsule 2   KLOR-CON M20 20 MEQ tablet TAKE 1 TABLET BY MOUTH EVERY DAY 90 tablet 1   lamoTRIgine (LAMICTAL) 100 MG tablet Take 1 tablet (100 mg total) by mouth 2 (two) times daily. 60 tablet 5   losartan (COZAAR) 100 MG tablet Take 1 tablet (100 mg total) by mouth daily. 30 tablet 1   triamcinolone cream (KENALOG) 0.1 % Apply 1 application topically 2 (two) times daily. 30 g 0   No current facility-administered medications for this visit.  Medication Side Effects: None  Allergies:  Allergies  Allergen Reactions   Aripiprazole     Other Reaction(s): akathisias, weight gain on 5 mg daily (43 lbs over 6 yrs.Only 6 lbs and no akathisias over 2 years.   Brexpiprazole     Other Reaction(s): itchy ankles, not effective on 1.5 mg qd   Lamotrigine     Other Reaction(s): no SE, not effective   Lithium     Other Reaction(s): no SE, not effective   Lurasidone     Other Reaction(s): increased appetite, not as "laid back:   Quetiapine     Other  Reaction(s): sleepy, increased weight, not effective   Sulfa Antibiotics Hives and Itching   Ziprasidone     Other Reaction(s): drowsiness on 40 mg twice daily   Codeine Itching and Rash   Sulfamethoxazole Rash   Sulfamethoxazole-Trimethoprim Rash    Past Medical History:  Diagnosis Date   Bipolar 1 disorder ([place])    Hyperlipidemia    Hypertension     Past Medical History, Surgical history, Social history, and Family history were reviewed and updated as appropriate.   Please see review of systems for further details on the patient's review from today.   Objective:   Physical Exam:  There were no vitals taken for this visit.  Physical Exam Constitutional:      General: He is not in acute distress. Musculoskeletal:        General: No deformity.  Neurological:     Mental Status: He is alert and oriented to person, place, and time.     Coordination: Coordination normal.  Psychiatric:        Attention and Perception: Attention and perception normal. He does not perceive auditory or visual hallucinations.        Mood and Affect: Mood normal. Mood is not anxious or depressed. Affect is not labile, blunt, angry or inappropriate.        Speech: Speech normal.        Behavior: Behavior normal.        Thought Content: Thought content normal. Thought content is not paranoid or delusional. Thought content does not include homicidal or suicidal ideation. Thought content does not include homicidal or suicidal plan.        Cognition and Memory: Cognition and memory normal.        Judgment: Judgment normal.     Comments: Insight intact     Lab Review:     Component Value Date/Time   NA 139 03/31/2021 1321   K 3.6 03/31/2021 1321   CL 98 03/31/2021 1321   CO2 31 03/31/2021 1321   GLUCOSE 110 (H) 03/31/2021 1321   BUN 11 03/31/2021 1321   CREATININE 1.04 03/31/2021 1321   CALCIUM 9.4 03/31/2021 1321   PROT 6.5 03/31/2021 1321   ALBUMIN 4.5 03/31/2021 1321   AST 22 03/31/2021  1321   ALT 33 03/31/2021 1321   ALKPHOS 75 03/31/2021 1321   BILITOT 1.0 03/31/2021 1321   GFRNONAA >90 03/20/2011 1955   GFRAA >90 03/20/2011 1955       Component Value Date/Time   WBC 9.2 03/31/2021 1321   RBC 5.05 03/31/2021 1321   HGB 15.9 03/31/2021 1321   HCT 45.8 03/31/2021 1321   PLT 193.0 03/31/2021 1321   MCV 90.7 03/31/2021 1321   MCH 31.0 03/20/2011 1955   MCHC 34.7 03/31/2021 1321   RDW 13.6 03/31/2021 1321    No results found for: "POCLITH", "LITHIUM"   No  results found for: "PHENYTOIN", "PHENOBARB", "VALPROATE", "CBMZ"   .res Assessment: Plan:    Plan:  PDMP reviewed  Lamictal '100mg'$  BID  Oxcarbazepine '300mg'$  BID - tapered off - feeling better  RTC 3 months  Patient advised to contact office with any questions, adverse effects, or acute worsening in signs and symptoms.  Counseled patient regarding potential benefits, risks, and side effects of Lamictal to include potential risk of Stevens-Johnson syndrome. Advised patient to stop taking Lamictal and contact office immediately if rash develops and to seek urgent medical attention if rash is severe and/or spreading quickly.     Diagnoses and all orders for this visit:  Bipolar I disorder ([place]) -     lamoTRIgine (LAMICTAL) 100 MG tablet; Take 1 tablet (100 mg total) by mouth 2 (two) times daily.  Generalized anxiety disorder  Insomnia, unspecified type     Please see After Visit Summary for patient specific instructions.  Future Appointments  Date Time Provider [place]  08/22/2022  2:30 PM Sater, Nanine Means, MD [facility]NA-[facility] None  08/24/2022  5:00 PM Shanon Ace, LCSW CP-CP None  09/07/2022  5:00 PM Shanon Ace, LCSW CP-CP None  09/08/2022  4:30 PM GI-315 CT 2 GI-315CT GI-[address]  09/13/2022  4:00 PM Talbot Grumbling, MD CHD-DERM None  09/14/2022  5:00 PM Shanon Ace, LCSW CP-CP None  10/04/2022  5:00 PM Shanon Ace, LCSW CP-CP None    No orders of the defined types were placed in this  encounter.   -------------------------------

## 2022-08-22 ENCOUNTER — Ambulatory Visit: Payer: BC Managed Care – PPO | Admitting: Neurology

## 2022-08-22 ENCOUNTER — Encounter: Payer: Self-pay | Admitting: Neurology

## 2022-08-22 VITALS — BP 128/69 | HR 59 | Ht 66.0 in | Wt 203.6 lb

## 2022-08-22 DIAGNOSIS — G5713 Meralgia paresthetica, bilateral lower limbs: Secondary | ICD-10-CM

## 2022-08-22 DIAGNOSIS — M5136 Other intervertebral disc degeneration, lumbar region: Secondary | ICD-10-CM

## 2022-08-22 DIAGNOSIS — G5712 Meralgia paresthetica, left lower limb: Secondary | ICD-10-CM

## 2022-08-22 DIAGNOSIS — G5711 Meralgia paresthetica, right lower limb: Secondary | ICD-10-CM | POA: Diagnosis not present

## 2022-08-22 DIAGNOSIS — F3181 Bipolar II disorder: Secondary | ICD-10-CM | POA: Diagnosis not present

## 2022-08-22 DIAGNOSIS — R208 Other disturbances of skin sensation: Secondary | ICD-10-CM | POA: Insufficient documentation

## 2022-08-24 ENCOUNTER — Ambulatory Visit (INDEPENDENT_AMBULATORY_CARE_PROVIDER_SITE_OTHER): Payer: BC Managed Care – PPO | Admitting: Psychiatry

## 2022-08-24 DIAGNOSIS — F319 Bipolar disorder, unspecified: Secondary | ICD-10-CM

## 2022-08-24 NOTE — Progress Notes (Signed)
[facility] Counselor/Therapist Progress Note  Patient ID: Andrew Glover, MRN: [id number],    Date: 08/24/2022  Time Spent: 58 minutes   Treatment Type: Individual Therapy  Reported Symptoms: anxiety, "dealing with family issues and how they impact me"  Mental Status Exam:  Appearance:   Casual and Neat     Behavior:  Appropriate, Sharing, and Motivated  Motor:  Normal  Speech/Language:   Clear and Coherent  Affect:  anxious  Mood:  anxious  Thought process:  goal directed  Thought content:    WNL  Sensory/Perceptual disturbances:    WNL  Orientation:  oriented to person, place, time/date, situation, day of week, month of year, year, and stated date of August 24, 2022  Attention:  Good  Concentration:  Good  Memory:  WNL  Fund of knowledge:   Good  Insight:    Good and Fair  Judgment:   Good and Fair  Impulse Control:  Fair   Risk Assessment: Danger to Self:  No Self-injurious Behavior: No Danger to Others: No Duty to Warn:no Physical Aggression / Violence:No  Access to Firearms a concern: No  Gang Involvement:No   Subjective:   Patient in today reporting symptoms of stress and anxiety mostly related to family/marital and personal issues. "Not much depression currently." Communication issues within the marriage and family continue to be very significant in their interpersonal difficulties. However he reports he and wife did "talk some recently but we still can't agree on things." Gave several examples of issues in their marriage and and asked wife about marital counseling (with a marital therapist outside of this practice). Admits personally "I need to be able to calm down, because I just get rared up" and I agreed with him and he is frequently aggravated, talking excessively and loud, and talking over other people, difficulty really hearing others. Worked with his communication issues in session today as he is becoming more aware of how he contributes to the difficulties  at home. Specifically focused on "not interrupting others when they are talking", being patient and talking when other person stops talking, really listen to what the other person is saying," and patient added that he "needs to mind my own business". Demonstrated more self-control in session today (verbally) and wants to continue improvement in that area. Patient quit drinking 5-6 months "due to my health". Is very sensitive and we discussed some of his strengths in addition to the issues on which he is working on in therapy.  Interesting that as patient left today, he turned around and said "I have really learned a lot today". Encouraged him to continue being open to learning as some of his behaviors and habits that are negatively impacting his life and his marriage are things he is going to need to "unlearn" and replace with healthier behaviors, and that today was a good start to that on his part.  Interventions: Cognitive Behavioral Therapy and Ego-Supportive  Treatment goals remain on treatment plan as patient works with strategies to achieve his goals.  Progress is noted each session and documented with patient and included in the progress/plan section of treatment note.   Reduce overall level, frequency, and intensity of the anxiety so that daily functioning is not impaired. Increase understanding of beliefs and messages that produce the worry and anxiety. Identify and use specific doping strategies for anxiety reduction.   Diagnosis:   ICD-10-CM   1. Bipolar I disorder ([place])  F31.9  Plan:   Patient today motivated and actively participating in session today focusing on his stress, anxiety, and some depression related to his personal/family/marital issues.  Attitude begin to change some in a more positive direction in session today.  Seeing how some of his behaviors are affecting the family and especially his marriage and negative ways rather than just blaming his wife.  Has made some  initial progress and showed increased motivation and determination and working on treatment goals.  Does need to continue working with goal-directed behaviors in order to continue moving in a positive direction. Encouraged patient in his practice of more positive/self affirming behaviors including: Staying in touch with supportive people, practice "the pause" as needed, staying in the present focusing on what he can control or change, getting outside daily and walking, recognize and emphasize his healthier strengths, refrain from assuming worst-case scenarios, healthy nutrition and exercise, practicing more consistent positive self talk, using 'timeouts' as needed, reduce overthinking and over analyzing, letting go of things from the past that can hold him back now, practice improved listening skills to really hear what someone else this is saying before he speaks back to them, and recognizing the strength he shows working with goal-directed behaviors to move in a direction that supports his improved emotional health and outlook.  Self rating scales: 1-10 depression scale-0 1-10 anxiety scale-8  Goal review and progress/challenges noted with patient.  Next appointment within 2 weeks.  This record has been created using [employer].  Chart creation errors have been sought, but may not always have been located and corrected.  Such creation errors do not reflect on the standard of medical care provided.   Shanon Ace, LCSW

## 2022-08-30 DIAGNOSIS — G4733 Obstructive sleep apnea (adult) (pediatric): Secondary | ICD-10-CM | POA: Diagnosis not present

## 2022-09-07 ENCOUNTER — Ambulatory Visit: Payer: BC Managed Care – PPO | Admitting: Psychiatry

## 2022-09-08 ENCOUNTER — Ambulatory Visit
Admission: RE | Admit: 2022-09-08 | Discharge: 2022-09-08 | Disposition: A | Discharge status: Home / Self Care | Payer: BC Managed Care – PPO | Source: Ambulatory Visit | Attending: Family Medicine | Admitting: Family Medicine

## 2022-09-08 DIAGNOSIS — Z122 Encounter for screening for malignant neoplasm of respiratory organs: Secondary | ICD-10-CM

## 2022-09-08 DIAGNOSIS — I2510 Atherosclerotic heart disease of native coronary artery without angina pectoris: Secondary | ICD-10-CM | POA: Diagnosis not present

## 2022-09-08 DIAGNOSIS — Z87891 Personal history of nicotine dependence: Secondary | ICD-10-CM | POA: Diagnosis not present

## 2022-09-08 DIAGNOSIS — J432 Centrilobular emphysema: Secondary | ICD-10-CM | POA: Diagnosis not present

## 2022-09-08 DIAGNOSIS — I771 Stricture of artery: Secondary | ICD-10-CM | POA: Diagnosis not present

## 2022-09-13 ENCOUNTER — Encounter: Payer: Self-pay | Admitting: Dermatology

## 2022-09-13 ENCOUNTER — Ambulatory Visit: Payer: BC Managed Care – PPO | Admitting: Dermatology

## 2022-09-13 DIAGNOSIS — B356 Tinea cruris: Secondary | ICD-10-CM

## 2022-09-13 MED ORDER — JUBLIA 10 % EX SOLN: 1.0000 | Freq: Every day | CUTANEOUS | 2 refills | Status: DC

## 2022-09-13 MED ORDER — KETOCONAZOLE 2 % EX CREA: 1.0000 | TOPICAL_CREAM | Freq: Every day | CUTANEOUS | 3 refills | Status: AC

## 2022-09-13 NOTE — Progress Notes (Signed)
   New Patient Visit  Subjective  Andrew Glover is a 62 y.o. male who presents for the following: Rash (Patient is here for a rash in the groin area. He has had it for about a year. Has seen a few providers with no relief from treatments. Treatments include: Terbinafine, Ketoconazole, and over the counter sprays. Itchiness is worse at night. ).    Objective  Well appearing patient in no apparent distress; mood and affect are within normal limits.  A focused examination was performed including groin . Relevant physical exam findings are noted in the Assessment and Plan.  Groin Red irritated rash due to fungal infection   Assessment & Plan  Tinea cruris Groin  Tinea corporis is a superficial fungal infection of the skin that can affect any part of the body, excluding the hands and feet, scalp, face and beard, groin, and nails. It is commonly called 'ringworm' as it presents with characteristic ring-shaped lesions.  General measures Skin should be kept clean and dried thoroughly. Loose-fitting light clothing is recommended in hot humid climates. Avoid close contact with infected individuals and the sharing of fomites. Examination of household members and pets for the source of infection and appropriate treatment reduces the risk of re-infection.  Specific measures Localised tinea corporis may respond to topical antifungal medications such as imidazoles and terbinafine. Application needs to include an adequate margin around the lesion and a prolonged course continuing for at least 1-2 weeks after the visible rash has cleared. However, recurrence is common.  Oral antifungal treatment is usually required if tinea corporis is involving a hair-bearing site, is extensive, or has failed to clear with topical antifungals.  With appropriate treatment and good patient compliance, tinea corporis can be cured. However, recurrence or re-infection can occur if treatment has stopped too soon, or the  source of infection has not been identified and treated.   Treatment includes:  -Ketoconazole Fluticasone twice a day for up to two weeks -Jublia cream daily Talc powder to the shoes and groin area of clothing.    Return in about 6 weeks (around 10/25/2022) for [place].

## 2022-09-13 NOTE — Patient Instructions (Addendum)
Treatment includes:  -Ketoconazole Fluticasone twice a day to groin area for up to two weeks then stop -Jublia solution to toenails daily -zeasorb powder to the shoes and groin area of clothing.   Due to recent changes in healthcare laws, you may see results of your pathology and/or laboratory studies on MyChart before the doctors have had a chance to review them. We understand that in some cases there may be results that are confusing or concerning to you. Please understand that not all results are received at the same time and often the doctors may need to interpret multiple results in order to provide you with the best plan of care or course of treatment. Therefore, we ask that you please give [place] 2 business days to thoroughly review all your results before contacting the office for clarification. Should we see a critical lab result, you will be contacted sooner.   If You Need Anything After Your Visit  If you have any questions or concerns for your doctor, please call our main line at [phone] If no one answers, please leave a voicemail as directed and we will return your call as soon as possible. Messages left after 4 pm will be answered the following business day.   You may also send [place] a message via [place]. We typically respond to MyChart messages within 1-2 business days.  For prescription refills, please ask your pharmacy to contact our office. Our fax number is [phone].  If you have an urgent issue when the clinic is closed that cannot wait until the next business day, you can page your doctor at the number below.    Please note that while we do our best to be available for urgent issues outside of office hours, we are not available 24/7.   If you have an urgent issue and are unable to reach [place], you may choose to seek medical care at your doctor's office, retail clinic, urgent care center, or emergency room.  If you have a medical emergency, please immediately call 911 or go  to the emergency department. In the event of inclement weather, please call our main line at [phone] for an update on the status of any delays or closures.  Dermatology Medication Tips: Please keep the boxes that topical medications come in in order to help keep track of the instructions about where and how to use these. Pharmacies typically print the medication instructions only on the boxes and not directly on the medication tubes.   If your medication is too expensive, please contact our office at [phone] or send [place] a message through [place].   We are unable to tell what your co-pay for medications will be in advance as this is different depending on your insurance coverage. However, we may be able to find a substitute medication at lower cost or fill out paperwork to get insurance to cover a needed medication.   If a prior authorization is required to get your medication covered by your insurance company, please allow [place] 1-2 business days to complete this process.  Drug prices often vary depending on where the prescription is filled and some pharmacies may offer cheaper prices.  The website www.goodrx.com contains coupons for medications through different pharmacies. The prices here do not account for what the cost may be with help from insurance (it may be cheaper with your insurance), but the website can give you the price if you did not use any insurance.  - You can print the associated coupon  and take it with your prescription to the pharmacy.  - You may also stop by our office during regular business hours and pick up a GoodRx coupon card.  - If you need your prescription sent electronically to a different pharmacy, notify our office through [facility] or by phone at [phone]

## 2022-09-14 ENCOUNTER — Ambulatory Visit (INDEPENDENT_AMBULATORY_CARE_PROVIDER_SITE_OTHER): Payer: BC Managed Care – PPO | Admitting: Psychiatry

## 2022-09-14 DIAGNOSIS — F319 Bipolar disorder, unspecified: Secondary | ICD-10-CM

## 2022-09-14 NOTE — Progress Notes (Signed)
[facility] Counselor/Therapist Progress Note  Patient ID: LONZA METHOT, MRN: [id number],    Date: 09/14/2022  Time Spent: 55 minutes   Treatment Type: Individual Therapy  Reported Symptoms: anxiety, irritability  Mental Status Exam:  Appearance:   Casual     Behavior:  Appropriate, Sharing, and Motivated  Motor:  Normal  Speech/Language:   Clear and Coherent  Affect:  Anxious, some irritability  Mood:  anxious and irritable  Thought process:  goal directed  Thought content:    Some obsessive thoughts and  rumination  Sensory/Perceptual disturbances:    WNL  Orientation:  oriented to person, place, time/date, situation, day of week, month of year, year, and stated date of September 14, 2022  Attention:  Good  Concentration:  Good  Memory:  WNL  Fund of knowledge:   Good  Insight:    Good and Fair  Judgment:   Good  Impulse Control:  Good and Fair   Risk Assessment: Danger to Self:  No Self-injurious Behavior: No Danger to Others: No Duty to Warn:no Physical Aggression / Violence:No  Access to Firearms a concern: No  Gang Involvement:No   Subjective:   Patient in session today and reporting anxiety, stress, personal/family/marital issues especially with communication.  Has shown some progress in trying to not respond to everything that triggers them, trying to practice more active listening, not talking over other people, and recognizing and building upon some points of progress. Depression  decreases and "not really feeling depressed now". Still having significant marital issues but haven't agreed yet to go to marital counseling but are considering it. Focused more with patient as he admits he can be difficult at times at home with wife and has a temper but never has harmed wife. Working well on his impulsivity when talking as this is a big issue in his marriage, and has been working in session as well as followup at home. He and wife have a long planned vacation coming up  over the next week and a half and we talked through several strategies in session today to better manage his anxiety and impulsivity, but also in being able to communicate in a healthier and more effective manner including not talking over other people, listening more intently, asking questions if he does not understand, and refraining from interrupting others when they are talking.  From observation in session and from what patient reports, it does seem he is putting forth effort to work on treatment goals and having some initial success.  Much less irritable by end of session.  Will see him again in approximately 2 weeks.  Interventions: Cognitive Behavioral Therapy and Ego-Supportive  Reduce overall level, frequency, and intensity of the anxiety so that daily functioning is not impaired. Increase understanding of beliefs and messages that produce the worry and anxiety. Identify and use specific doping strategies for anxiety reduction.    Diagnosis:   ICD-10-CM   1. Bipolar I disorder  F31.9      Plan:  Patient today showing good motivation and participation in session as he worked further on his anxiety, stress, personal issues, communication within the family/marriage, being able to not respond to everything someone else is that he knows triggers him and negative ways.  At last session, patient did seem to move a little more in a positive direction and experience some attitudinal changes which seemed to be encouraging for him. Encouraged patient in his practice of positive/self affirming behaviors including: Practicing "the pause"  as needed, staying in touch with supportive people, remaining in the present focusing on what he can change or control, getting outside daily and walking, recognize and emphasize his strengths, refrain from assuming worst-case scenarios, healthy nutrition and exercise, practicing more consistent positive self talk, using "timeouts" as needed, reduce overthinking and over  analyzing, letting go of things from the past that hold him back now, practice improved listening skills to really hear what someone else is saying before he speaks back to them, and realize the strength he shows when working with goal-directed behaviors to move in a direction that supports his improved emotional health and outlook.  Self rating scales: 1-10 depression scale-0 1-10 anxiety scale-7  Goal review and progress/challenges noted with patient.  Next appointment within 2 weeks.  This record has been created using [employer].  Chart creation errors have been sought, but may not always have been located and corrected.  Such creation errors do not reflect on the standard of medical care provided.   Shanon Ace, LCSW

## 2022-09-30 DIAGNOSIS — G4733 Obstructive sleep apnea (adult) (pediatric): Secondary | ICD-10-CM | POA: Diagnosis not present

## 2022-10-04 ENCOUNTER — Ambulatory Visit (INDEPENDENT_AMBULATORY_CARE_PROVIDER_SITE_OTHER): Payer: BC Managed Care – PPO | Admitting: Psychiatry

## 2022-10-04 DIAGNOSIS — F319 Bipolar disorder, unspecified: Secondary | ICD-10-CM | POA: Diagnosis not present

## 2022-10-04 NOTE — Progress Notes (Signed)
[facility] Counselor/Therapist Progress Note  Patient ID: Andrew Glover, MRN: [id number],    Date: 10/04/2022  Time Spent: 58 minutes   Treatment Type: Individual Therapy  Reported Symptoms: anxiety, frustration  Mental Status Exam:  Appearance:   Casual     Behavior:  Appropriate, Sharing, and Motivated  Motor:  Normal  Speech/Language:   Clear and Coherent  Affect:  anxious  Mood:  anxious  Thought process:  goal directed  Thought content:    WNL  Sensory/Perceptual disturbances:    WNL  Orientation:  oriented to person, place, time/date, situation, day of week, month of year, year, and stated date of October 04, 2022  Attention:  Good  Concentration:  Good  Memory:  WNL  Fund of knowledge:   Good  Insight:    Good  Judgment:   Good and Fair  Impulse Control:  Good and Fair   Risk Assessment: Danger to Self:  No Self-injurious Behavior: No Danger to Others: No Duty to Warn:no Physical Aggression / Violence:No  Access to Firearms a concern: No  Gang Involvement:No   Subjective:   Patient in session today and reporting anxiety and personal/family/marital issues particularly in their communication. Focused more today on communication skills especially with wife and step-daughter (age 36). Lots of differences and judging each other. Patient reports his difficulties in temper management, blaming, needs improved listening skills, impatient, sometimes jumps to conclusions.  Worked today on his behavior, especially not really listening and jumping to conclusions, paying attention to "how he says what he says". Difficult for patient but his did show good effort  and agreed to keep working on this between now and next session. Does show some progress in not responding to everything that triggers his frustration or anger.  Showing some progress in active listening. Working harder to not talk over others. Does see some progress in his being able to defuse some family situations and  choosing his responses more carefully rather than just "blurting something out that could be hurtful or offensive to others." Depression continues to be decreased.  Working to better manage anxiety and impulsivity, communicate in healthier ways within the family, not talking over people, listening more intently, and asking questions if he does not understand, while refraining from interrupting others when they are talking.  Understands and is committed to working on healthier ways of communicating including his listening as well as his talking.  Does show some good resilience at times in session and is motivated, admitting that this involves difficult changing for him as over the course of his life, he has not been a part of a family that communicated well.  Next appointment within 2 weeks and he is to work on homework assignment given regarding resisting the tendency to interrupt people and practice "active listening."  Interventions: Cognitive Behavioral Therapy and Ego-Supportive  Reduce overall level, frequency, and intensity of the anxiety so that daily functioning is not impaired. Increase understanding of beliefs and messages that produce the worry and anxiety. Identify and use specific doping strategies for anxiety reduction.   Diagnosis:   ICD-10-CM   1. Bipolar I disorder  F31.9      Plan:   Patient today actively participating well in session as he continued his work on anxiety, and personal/family/marital issues particularly in communication.  These issues are tough for patient but he is showing motivation and some determination and working on them in session.  The fact he is motivated and participating  well is a good sign for him and his ability to make some progress.  Needs to continue working with goal-directed behaviors in order to move in a forward direction. Encouraged patient in more positive and self affirming behaviors between sessions including: Staying in touch with supportive  people, stay in the present focusing on what he can change or control, get outside daily and walk, recognize and emphasize his strengths, refrain from assuming worst-case scenarios, healthy nutrition and exercise, practicing more consistent positive self talk, using "timeouts" as needed, reduce overthinking and over analyzing, letting go of things from the past that hold him back now, practice improved listening skills to really hear what someone else is saying before he speaks back to them, and recognize the strength he shows when working with goal-directed behaviors to move in a direction that supports his improved emotional health and overall wellbeing.  Self rating scales: 1-10 depression scale-1 (relating to family) 1-10 anxiety scale-7  Goal review and progress/challenges noted with patient.  Next appointment within 2 weeks.  This record has been created using [employer].  Chart creation errors have been sought, but may not always have been located and corrected.  Such creation errors do not reflect on the standard of medical care provided.   Mathis Fare, LCSW

## 2022-10-10 ENCOUNTER — Ambulatory Visit: Payer: BC Managed Care – PPO | Admitting: Neurology

## 2022-10-24 ENCOUNTER — Ambulatory Visit (INDEPENDENT_AMBULATORY_CARE_PROVIDER_SITE_OTHER): Payer: BC Managed Care – PPO | Admitting: Psychiatry

## 2022-10-24 ENCOUNTER — Encounter: Payer: Self-pay | Admitting: Dermatology

## 2022-10-24 ENCOUNTER — Telehealth: Payer: Self-pay | Admitting: Dermatology

## 2022-10-24 DIAGNOSIS — F319 Bipolar disorder, unspecified: Secondary | ICD-10-CM | POA: Diagnosis not present

## 2022-10-24 NOTE — Progress Notes (Signed)
[facility] Counselor/Therapist Progress Note  Patient ID: Andrew Glover, MRN: [id number],    Date: 10/24/2022  Time Spent: 55 minutes   Treatment Type: Individual Therapy  Reported Symptoms: anxiety, depression, verbal "fighting" with wife-no physical contact  Mental Status Exam:  Appearance:   Casual     Behavior:  Appropriate, Sharing, and Motivated  Motor:  Normal  Speech/Language:   Clear and Coherent  Affect:  Depressed and anxious  Mood:  anxious and depressed  Thought process:  goal directed  Thought content:    Rumination and obsessive thoughts  Sensory/Perceptual disturbances:    WNL  Orientation:  oriented to person, place, time/date, situation, day of week, month of year, year, and stated date of Oct 24, 2022  Attention:  Good  Concentration:  Good  Memory:  WNL  Fund of knowledge:   Good  Insight:    Good and Fair  Judgment:   Good  Impulse Control:  Good and Fair   Risk Assessment: Danger to Self:  No Self-injurious Behavior: No Danger to Others: No Duty to Warn:no Physical Aggression / Violence:No  Access to Firearms a concern: No  Gang Involvement:No   Subjective: Patient in for session today and reporting anxiety, depression, and personal/family/marital issues in the home, including communication and "personal differences". Reports "things since last appt have been difficulty mostly with interacting and communicating at home with wife mostly" but "sometimes a daughter also." Tendency to judge each other, especially with husband and wife. Short-tempered at times "but I'm never physical with it." Focusing more today on listening skills and practicing some healthier skills in both listening and waiting for others to stop talking before he starts, rather than talking over other people. Wanting to get back "in a church" as he feels that would feed him spiritually and help him emotionally also. Does see some progress in himself and some changes he is trying to  make, for example "defusing situations, especially at home.". Wants to be able to calm down more quickly and not be "offensive and I'd be able to diffuse situations and walk away from them. Temper has been some worse recently but denies any outburst that were physical . "Wife sometimes does things that made me mad and I react verbally not physically. Does state what he wants to be able to do is just walk away rather than responding in verbally abusive ways. Currently working on his individual issues but plans to go for marital counseling eventually  if wife is willing to go with him. Patient has continued working on recognizing his problems with temper management (improving), blaming (some better),  listening skills, impatience (improving some), and does seem to try and be honest in evaluating his needs and what his is willing to work on to improve. Trying to improve his listening, his ability to defuse situations, refrain from blurting out offensive or hurtful comments, and looking for more positives versus negatives in others and in himself.  Also working to strengthen his "active listening" skills and is showing some progress.  Has worked on homework between session and intentionally working to not interrupt other people as they are talking.  Interventions: Cognitive Behavioral Therapy and Ego-Supportive  Reduce overall level, frequency, and intensity of the anxiety so that daily functioning is not impaired. Increase understanding of beliefs and messages that produce the worry and anxiety. Identify and use specific doping strategies for anxiety reduction.   Diagnosis:   ICD-10-CM   1. Bipolar I disorder (  HCC)  F31.9      Plan: Patient in today and showing good motivation and participation as he worked more on his anxiety, depression, anger/frustration, and overall self-awareness (noticing the positives and also the areas in which he needs to continue working).  These issues are very challenging for  patient and he is also continuing to show motivation and determination and working with goal-directed behaviors.  Does need to continue this work in order to continue making progress and moving in a forward direction.  I reminded him of the positives that he is demonstrating in hopes that this will help continue to motivate him to make better choices and be more aware of his behavior and how it impacts others. Encouraged patient in more positive and self affirming behaviors between sessions including: Staying in the present focusing on what he can change or control, staying in contact with supportive people, getting outside daily and walking, recognize and emphasize his strengths, refrain from assuming worst-case scenarios, healthy nutrition and exercise, practicing more consistent positive self talk, using "timeouts" as needed, reduce overthinking and over analyzing, letting go of things from the past that hold him back now, practice improved listening skills to really hear what someone else is saying before he speaks back to them, and realize the strength he shows when working with goal-directed behaviors to move in a direction that supports his improved emotional health and outlook.  Self rating scales: 1-10 depression scale-3(relating to family) 1-10 anxiety scale-7 1-10 self-esteem scale-8 1-10 motivation scale-7 1-10 hopefulness scale-9  Goal review and progress/challenges noted with patient.  Next appointment within 2 to 3 weeks.   Mathis Fare, LCSW

## 2022-10-24 NOTE — Telephone Encounter (Signed)
Patient is concerned about the medication he is using on his feet , said that the spot is very red. Would like to speak with someone in regards to his concerns.

## 2022-10-25 NOTE — Telephone Encounter (Signed)
Spoke with patients wife. She was asking if the medication can be used in between the toes where it is red. Spoke with Dr. Onalee Hua, she advised to use the ketoconazole in those red areas. If the foot is infected and has drainage or bleeding to go to urgent care. I advised if the patient could not wait until that appointment to call us back Patient has an outstanding appointment on 5/21

## 2022-10-30 DIAGNOSIS — G4733 Obstructive sleep apnea (adult) (pediatric): Secondary | ICD-10-CM | POA: Diagnosis not present

## 2022-10-31 ENCOUNTER — Encounter: Payer: Self-pay | Admitting: Dermatology

## 2022-10-31 ENCOUNTER — Ambulatory Visit: Payer: BC Managed Care – PPO | Admitting: Psychiatry

## 2022-10-31 ENCOUNTER — Ambulatory Visit: Payer: BC Managed Care – PPO | Admitting: Dermatology

## 2022-10-31 DIAGNOSIS — B356 Tinea cruris: Secondary | ICD-10-CM | POA: Diagnosis not present

## 2022-10-31 DIAGNOSIS — B351 Tinea unguium: Secondary | ICD-10-CM

## 2022-10-31 DIAGNOSIS — B353 Tinea pedis: Secondary | ICD-10-CM

## 2022-10-31 DIAGNOSIS — L918 Other hypertrophic disorders of the skin: Secondary | ICD-10-CM | POA: Diagnosis not present

## 2022-10-31 NOTE — Patient Instructions (Addendum)
Treatment for Feet: -Discontinue Ketoconazole cream. Start putting antifungal powder in shoes  Treatment for Groin: -Apply Tacrolimus cream as needed for itch -Continue antifungal powder   Treatment for Toenails: -Continue Jublia for a year   Cryotherapy Aftercare  Wash gently with soap and water everyday.   Apply Vaseline and Band-Aid daily until healed.   Due to recent changes in healthcare laws, you may see results of your pathology and/or laboratory studies on MyChart before the doctors have had a chance to review them. We understand that in some cases there may be results that are confusing or concerning to you. Please understand that not all results are received at the same time and often the doctors may need to interpret multiple results in order to provide you with the best plan of care or course of treatment. Therefore, we ask that you please give [place] 2 business days to thoroughly review all your results before contacting the office for clarification. Should we see a critical lab result, you will be contacted sooner.   If You Need Anything After Your Visit  If you have any questions or concerns for your doctor, please call our main line at [phone] If no one answers, please leave a voicemail as directed and we will return your call as soon as possible. Messages left after 4 pm will be answered the following business day.   You may also send [place] a message via MyChart. We typically respond to MyChart messages within 1-2 business days.  For prescription refills, please ask your pharmacy to contact our office. Our fax number is [phone].  If you have an urgent issue when the clinic is closed that cannot wait until the next business day, you can page your doctor at the number below.    Please note that while we do our best to be available for urgent issues outside of office hours, we are not available 24/7.   If you have an urgent issue and are unable to reach [place], you may  choose to seek medical care at your doctor's office, retail clinic, urgent care center, or emergency room.  If you have a medical emergency, please immediately call 911 or go to the emergency department. In the event of inclement weather, please call our main line at [phone] for an update on the status of any delays or closures.  Dermatology Medication Tips: Please keep the boxes that topical medications come in in order to help keep track of the instructions about where and how to use these. Pharmacies typically print the medication instructions only on the boxes and not directly on the medication tubes.   If your medication is too expensive, please contact our office at [phone] or send [place] a message through MyChart.   We are unable to tell what your co-pay for medications will be in advance as this is different depending on your insurance coverage. However, we may be able to find a substitute medication at lower cost or fill out paperwork to get insurance to cover a needed medication.   If a prior authorization is required to get your medication covered by your insurance company, please allow [place] 1-2 business days to complete this process.  Drug prices often vary depending on where the prescription is filled and some pharmacies may offer cheaper prices.  The website www.goodrx.com contains coupons for medications through different pharmacies. The prices here do not account for what the cost may be with help from insurance (it may be cheaper with your  insurance), but the website can give you the price if you did not use any insurance.  - You can print the associated coupon and take it with your prescription to the pharmacy.  - You may also stop by our office during regular business hours and pick up a GoodRx coupon card.  - If you need your prescription sent electronically to a different pharmacy, notify our office through [facility] or by phone at [phone]

## 2022-10-31 NOTE — Progress Notes (Signed)
   Follow-Up Visit   Subjective  Andrew Glover is a 62 y.o. male who presents for the following: Tinea Cruris  Patient is here for a follow up on Tinea Cruris. Ketoconazole Fluticasone was given and Jublia. The treatment plan did help. Patient is itching a little in the groin area. He has continued to use the Talc Powder. He is not currently using any creams. Skin texture has improved.   The following portions of the chart were reviewed this encounter and updated as appropriate: medications, allergies, medical history  Review of Systems:  No other skin or systemic complaints except as noted in HPI or Assessment and Plan.  Objective  Well appearing patient in no apparent distress; mood and affect are within normal limits.  A focused examination was performed of the following areas: Groin and toes  Relevant exam findings are noted in the Assessment and Plan.  Right Axilla Erythematous fleshy, skin-colored pedunculated papules    Assessment & Plan   TINEA CRURIS Exam: faint residual erythema, overall much improved  Treatment Plan: Tacrolimus cream to apply as needed for itch Continue antifungal powder daily to prevent recurrence  ONYCHO MYCOSIS  Exam: yellow discolored toenails with subungual debris   Flared  Treatment Plan: Continue Jublia for a year   Inflamed skin tag Right Axilla  Destruction of lesion - Right Axilla Complexity: simple   Destruction method: cryotherapy   Informed consent: discussed and consent obtained   Timeout:  patient name, date of birth, surgical site, and procedure verified Lesion destroyed using liquid nitrogen: Yes   Region frozen until ice ball extended beyond lesion: Yes   Outcome: patient tolerated procedure well with no complications   Post-procedure details: wound care instructions given    TINEA PEDIS Exam: Scaling and maceration web spaces and over distal and lateral soles.  Treatment Plan: Start putting antifungal powder  in the shoes. Possible reaction to ketoconazole cream-discontinue  No follow-ups on file.    Documentation: I have reviewed the above documentation for accuracy and completeness, and I agree with the above.  Langston Reusing, DO   I, Germaine Pomfret, CMA, am acting as scribe for [employer], DO.

## 2022-11-01 ENCOUNTER — Other Ambulatory Visit: Payer: Self-pay

## 2022-11-01 ENCOUNTER — Telehealth: Payer: Self-pay | Admitting: Dermatology

## 2022-11-01 MED ORDER — TACROLIMUS 0.1 % EX OINT: TOPICAL_OINTMENT | Freq: Two times a day (BID) | CUTANEOUS | 0 refills | Status: DC

## 2022-11-01 NOTE — Telephone Encounter (Signed)
Medication sent.

## 2022-11-01 NOTE — Telephone Encounter (Signed)
Patient wife called stating that they went to the pharmacy to pick the prescription but the pharmacy stated that they had not received anything for him.  Patient wife asked that we please follow up with them once the prescription has been sent over.

## 2022-11-03 DIAGNOSIS — F3181 Bipolar II disorder: Secondary | ICD-10-CM | POA: Diagnosis not present

## 2022-11-03 DIAGNOSIS — E1169 Type 2 diabetes mellitus with other specified complication: Secondary | ICD-10-CM | POA: Diagnosis not present

## 2022-11-03 DIAGNOSIS — E782 Mixed hyperlipidemia: Secondary | ICD-10-CM | POA: Diagnosis not present

## 2022-11-03 DIAGNOSIS — H401131 Primary open-angle glaucoma, bilateral, mild stage: Secondary | ICD-10-CM | POA: Diagnosis not present

## 2022-11-03 DIAGNOSIS — Z23 Encounter for immunization: Secondary | ICD-10-CM | POA: Diagnosis not present

## 2022-11-03 DIAGNOSIS — Z125 Encounter for screening for malignant neoplasm of prostate: Secondary | ICD-10-CM | POA: Diagnosis not present

## 2022-11-03 DIAGNOSIS — I10 Essential (primary) hypertension: Secondary | ICD-10-CM | POA: Diagnosis not present

## 2022-11-03 DIAGNOSIS — Z0000 Encounter for general adult medical examination without abnormal findings: Secondary | ICD-10-CM | POA: Diagnosis not present

## 2022-11-15 ENCOUNTER — Ambulatory Visit (INDEPENDENT_AMBULATORY_CARE_PROVIDER_SITE_OTHER): Payer: BC Managed Care – PPO | Admitting: Psychiatry

## 2022-11-15 DIAGNOSIS — F319 Bipolar disorder, unspecified: Secondary | ICD-10-CM | POA: Diagnosis not present

## 2022-11-15 NOTE — Progress Notes (Signed)
[facility] Counselor/Therapist Progress Note  Patient ID: Andrew Glover, MRN: [id number],    Date: 11/15/2022  Time Spent: 55 minutes   Treatment Type: Individual Therapy  Reported Symptoms:  anxiety, "my bipolar ups and downs but racing thoughts have decreased"  Mental Status Exam:  Appearance:   Casual     Behavior:  Appropriate, Sharing, and Motivated  Motor:  Normal  Speech/Language:   Clear and Coherent  Affect:  anxious  Mood:  anxious  Thought process:  goal directed  Thought content:    Rumination  Sensory/Perceptual disturbances:    WNL  Orientation:  oriented to person, place, time/date, situation, day of week, month of year, year, and stated date of November 15, 2022  Attention:  Good  Concentration:  Good and Fair  Memory:  WNL  Fund of knowledge:   Good  Insight:    Good and Fair  Judgment:   Good  Impulse Control:  Good   Risk Assessment: Danger to Self:  No Self-injurious Behavior: No Danger to Others: No Duty to Warn:no Physical Aggression / Violence:No  Access to Firearms a concern: No  Gang Involvement:No   Subjective:   Patient in session today and reporting anxiety as main symptom, had some racing thoughts earlier but have decreased, and feeling more leveled-out today. Receiving some marital therapy which has recently begun and is very much needed. Patient reports some improvement within marital relationship and family overall, but "still having some issues and differences of opinion with wife and extended family." Boundaries within family are loose in some ways and trying to improve on this. Patient working further his treatment goals and admits when he feels he "makes mistakes". Wanting family to be better but also feeling "we have to keep good boundaries and we don't allow them to act like they use to at our house and not respect [place] nor our house." Worked further today on his outlook, personal/family issues, communication, anxiety, depression, and  personal differences between patient and others in family.  Some decrease in judging of wife and some decrease in his short-temperedness.Denies any physical conflict at home. Continues to "try and defuse situations" especially between he and his wife. Continued decrease in his active listening skills. Continued work on his management of temper, any impulsivity and definitely making progress.Seeing some of his gains as being : calming down more easily,  not being destructive, significant decrease in verbal outbursts, and remains motivated in treatment.  Interventions: Cognitive Behavioral Therapy and Ego-Supportive  Reduce overall level, frequency, and intensity of the anxiety so that daily functioning is not impaired. Increase understanding of beliefs and messages that produce the worry and anxiety. Identify and use specific doping strategies for anxiety reduction.   Diagnosis:   ICD-10-CM   1. Bipolar I disorder (HCC)  F31.9      Plan:   Patient actively participating and showing good motivation in session today working more on his anxiety, temper issues, communication, and family relationships and is showing progress. Needs to continue working with goal-directed behaviors to continue moving in a forward direction.Encouraged patient in his practice of more positive and self affirming behaviors between sessions including: Staying in the present and focusing on what he can control or change, remain in contact with supportive people, getting outside daily and walking, recognize and emphasize his strengths, refrain from assuming worst-case scenarios, healthy nutrition and exercise, practicing more consistent positive self talk, using "timeouts" as needed, reduce overthinking and over analyzing, letting go  of things from the past that hold him back now, practice improved listening skills to really hear what someone else is saying before he speaks back to them, and recognize the strength he shows when  working with goal-directed behaviors to move in a direction that supports his improved emotional health and overall wellbeing.  Self rating scales: (updated 11/15/2022) 1-10 depression scale-2 (relating to family) 1-10 anxiety scale-8/9 1-10 self-esteem scale-8 1-10 motivation scale-7 1-10 hopefulness scale-6/7  Goal review and progress/challenges noted with patient.  Next appointment within 2 to 3 weeks.   Mathis Fare, LCSW

## 2022-11-21 ENCOUNTER — Encounter: Payer: Self-pay | Admitting: Adult Health

## 2022-11-21 ENCOUNTER — Ambulatory Visit: Payer: BC Managed Care – PPO | Admitting: Adult Health

## 2022-11-21 DIAGNOSIS — F411 Generalized anxiety disorder: Secondary | ICD-10-CM | POA: Diagnosis not present

## 2022-11-21 DIAGNOSIS — G4700 Insomnia, unspecified: Secondary | ICD-10-CM | POA: Diagnosis not present

## 2022-11-21 DIAGNOSIS — F319 Bipolar disorder, unspecified: Secondary | ICD-10-CM

## 2022-11-21 MED ORDER — LAMOTRIGINE 100 MG PO TABS: 100.0000 mg | ORAL_TABLET | Freq: Two times a day (BID) | ORAL | 1 refills | Status: DC

## 2022-11-21 NOTE — Progress Notes (Signed)
Andrew Glover [id number] May 11, 1961 62 y.o.  Subjective:   Patient ID:  Andrew Glover is a 62 y.o. (DOB February 19, 1961) male.  Chief Complaint: No chief complaint on file.   HPI DYLEN MORNING presents to the office today for follow-up of BPD-1, GAD, and insomnia.  Describes mood today as "ok". Pleasant. Denies tearfulness. Mood symptoms - denies depression and anxiety. Reports irritability at times. Reports some over thinking. Denies obsessive thoughts and acts. Mood is more consistent - "level". Stating "I feel like I'm doing alright". Feels like Lamictal continues to work well. Stable interest and motivation. Taking medications as prescribed.  Energy levels good. Active, does not have a regular exercise routine.  Enjoys some usual interests and activities. Married. Lives with wife. Has 3 step children. Spending time with family and friends. Appetite adequate. Weight 205 pounds. Sleeps well most nights. Averages 7 hours. Focus and concentration stable. Completing tasks. Managing aspects of household. Works at [employer] x 44 years. Denies SI or HI.  Denies AH or VH. Denies self harm. Stopped THC and Alcohol x 6 months ago.  Previous medication trials: Abilify, Lamictal, Latuda, Lithium, Rexulti, Trileptal, Geodon  GAD-7    Flowsheet Row Office Visit from 09/20/2021 in [facility] at [employer] Visit from 12/16/2020 in [facility] [employer]  Total GAD-7 Score 1 10      PHQ2-9    Flowsheet Row Office Visit from 09/20/2021 in [facility] Visit from 03/18/2021 in [facility] Visit from 12/16/2020 in [facility] [employer]  PHQ-2 Total Score 0 0 0  PHQ-9 Total Score 0 -- 10        Review of Systems:  Review of Systems  Musculoskeletal:  Negative for gait problem.  Neurological:  Negative for tremors.   Psychiatric/Behavioral:         Please refer to HPI    Medications: I have reviewed the patient's current medications.  Current Outpatient Medications  Medication Sig Dispense Refill   ascorbic acid (VITAMIN C) 1000 MG tablet Take 1,000 mg by mouth daily.     aspirin 81 MG EC tablet Take 1 tablet by mouth every morning.     atenolol-chlorthalidone (TENORETIC) 50-25 MG tablet Take 1 tablet by mouth daily. 30 tablet 1   atorvastatin (LIPITOR) 40 MG tablet Take 1 tablet (40 mg total) by mouth daily. Take 40 mg by mouth daily. 30 tablet 1   cetirizine (ZYRTEC) 10 MG tablet Take 10 mg by mouth daily.     clobetasol cream (TEMOVATE) 0.05 % Apply 1 Application topically as needed. (Patient not taking: Reported on 09/13/2022)     Efinaconazole (JUBLIA) 10 % SOLN Apply 1 Application topically daily. TO AFFECTED TOES FOR SIX TO EIGHT MONTHS 8 mL 2   ergocalciferol (VITAMIN D2) 1.25 MG (50000 UT) capsule Take by mouth.     ketoconazole (NIZORAL) 2 % cream Apply 1 Application topically daily. TO BOTTOMS OF FEET AND IN BETWEEN TOES 45 g 3   lamoTRIgine (LAMICTAL) 100 MG tablet Take 1 tablet (100 mg total) by mouth 2 (two) times daily. 180 tablet 1   losartan (COZAAR) 100 MG tablet Take 1 tablet (100 mg total) by mouth daily. 30 tablet 1   Multiple Vitamin (MULTIVITAMIN PO) Take 1 tablet by mouth daily.     tacrolimus (PROTOPIC) 0.1 % ointment Apply topically 2 (two) times daily. Apply topically  everyday until clear 100 g 0   triamcinolone cream (KENALOG) 0.1 % Apply 1 application topically 2 (two) times daily. (Patient taking differently: Apply 1 application  topically as needed.) 30 g 0   No current facility-administered medications for this visit.    Medication Side Effects: None  Allergies:  Allergies  Allergen Reactions   Aripiprazole     Other Reaction(s): akathisias, weight gain on 5 mg daily (43 lbs over 6 yrs.Only 6 lbs and no akathisias over 2 years.   Brexpiprazole     Other  Reaction(s): itchy ankles, not effective on 1.5 mg qd   Lamotrigine     Other Reaction(s): no SE, not effective   Lithium     Other Reaction(s): no SE, not effective   Lurasidone     Other Reaction(s): increased appetite, not as "laid back:   Quetiapine     Other Reaction(s): sleepy, increased weight, not effective   Sulfa Antibiotics Hives and Itching   Ziprasidone     Other Reaction(s): drowsiness on 40 mg twice daily   Codeine Itching and Rash   Sulfamethoxazole Rash   Sulfamethoxazole-Trimethoprim Rash    Past Medical History:  Diagnosis Date   Bipolar 1 disorder (HCC)    Hyperlipidemia    Hypertension     Past Medical History, Surgical history, Social history, and Family history were reviewed and updated as appropriate.   Please see review of systems for further details on the patient's review from today.   Objective:   Physical Exam:  There were no vitals taken for this visit.  Physical Exam Constitutional:      General: He is not in acute distress. Musculoskeletal:        General: No deformity.  Neurological:     Mental Status: He is alert and oriented to person, place, and time.     Coordination: Coordination normal.  Psychiatric:        Attention and Perception: Attention and perception normal. He does not perceive auditory or visual hallucinations.        Mood and Affect: Mood normal. Mood is not anxious or depressed. Affect is not labile, blunt, angry or inappropriate.        Speech: Speech normal.        Behavior: Behavior normal.        Thought Content: Thought content normal. Thought content is not paranoid or delusional. Thought content does not include homicidal or suicidal ideation. Thought content does not include homicidal or suicidal plan.        Cognition and Memory: Cognition and memory normal.        Judgment: Judgment normal.     Comments: Insight intact     Lab Review:     Component Value Date/Time   NA 139 03/31/2021 1321   K 3.6  03/31/2021 1321   CL 98 03/31/2021 1321   CO2 31 03/31/2021 1321   GLUCOSE 110 (H) 03/31/2021 1321   BUN 11 03/31/2021 1321   CREATININE 1.04 03/31/2021 1321   CALCIUM 9.4 03/31/2021 1321   PROT 6.5 03/31/2021 1321   ALBUMIN 4.5 03/31/2021 1321   AST 22 03/31/2021 1321   ALT 33 03/31/2021 1321   ALKPHOS 75 03/31/2021 1321   BILITOT 1.0 03/31/2021 1321   GFRNONAA >90 03/20/2011 1955   GFRAA >90 03/20/2011 1955       Component Value Date/Time   WBC 9.2 03/31/2021 1321   RBC 5.05 03/31/2021 1321   HGB 15.9 03/31/2021 1321  HCT 45.8 03/31/2021 1321   PLT 193.0 03/31/2021 1321   MCV 90.7 03/31/2021 1321   MCH 31.0 03/20/2011 1955   MCHC 34.7 03/31/2021 1321   RDW 13.6 03/31/2021 1321    No results found for: "POCLITH", "LITHIUM"   No results found for: "PHENYTOIN", "PHENOBARB", "VALPROATE", "CBMZ"   .res Assessment: Plan:    Plan:  PDMP reviewed  Working with therapist - Rockne Menghini  He and wife working with marriage counselor.  Lamictal 100mg  BID  RTC 3 months  Patient advised to contact office with any questions, adverse effects, or acute worsening in signs and symptoms.  Counseled patient regarding potential benefits, risks, and side effects of Lamictal to include potential risk of Stevens-Johnson syndrome. Advised patient to stop taking Lamictal and contact office immediately if rash develops and to seek urgent medical attention if rash is severe and/or spreading quickly.   Diagnoses and all orders for this visit:  Bipolar I disorder (HCC) -     lamoTRIgine (LAMICTAL) 100 MG tablet; Take 1 tablet (100 mg total) by mouth 2 (two) times daily.  Insomnia, unspecified type  Generalized anxiety disorder     Please see After Visit Summary for patient specific instructions.  Future Appointments  Date Time Provider Department Center  11/23/2022  5:00 PM Mathis Fare, LCSW [facility] None  12/04/2022  5:00 PM Mathis Fare, LCSW [facility] None  12/18/2022  5:00 PM  Mathis Fare, LCSW [facility] None  01/03/2023  5:00 PM Mathis Fare, LCSW [facility] None  05/01/2023  4:15 PM Terri Piedra, DO [facility] None    No orders of the defined types were placed in this encounter.   -------------------------------

## 2022-11-22 ENCOUNTER — Encounter: Payer: Self-pay | Admitting: Dermatology

## 2022-11-23 ENCOUNTER — Ambulatory Visit (INDEPENDENT_AMBULATORY_CARE_PROVIDER_SITE_OTHER): Payer: BC Managed Care – PPO | Admitting: Psychiatry

## 2022-11-23 DIAGNOSIS — F319 Bipolar disorder, unspecified: Secondary | ICD-10-CM | POA: Diagnosis not present
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
## 2022-11-24 DIAGNOSIS — R21 Rash and other nonspecific skin eruption: Secondary | ICD-10-CM | POA: Diagnosis not present

## 2022-11-28 DIAGNOSIS — G4733 Obstructive sleep apnea (adult) (pediatric): Secondary | ICD-10-CM | POA: Diagnosis not present

## 2022-11-30 DIAGNOSIS — G4733 Obstructive sleep apnea (adult) (pediatric): Secondary | ICD-10-CM | POA: Diagnosis not present

## 2022-12-04 ENCOUNTER — Ambulatory Visit (INDEPENDENT_AMBULATORY_CARE_PROVIDER_SITE_OTHER): Payer: BC Managed Care – PPO | Admitting: Psychiatry

## 2022-12-04 DIAGNOSIS — F319 Bipolar disorder, unspecified: Secondary | ICD-10-CM | POA: Diagnosis not present
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
## 2022-12-11 ENCOUNTER — Other Ambulatory Visit: Payer: Self-pay

## 2022-12-11 ENCOUNTER — Telehealth: Payer: Self-pay | Admitting: Dermatology

## 2022-12-11 MED ORDER — TACROLIMUS 0.1 % EX OINT: TOPICAL_OINTMENT | Freq: Two times a day (BID) | CUTANEOUS | 0 refills | Status: DC

## 2022-12-11 NOTE — Telephone Encounter (Signed)
I spoke to his wife. I reviewed what the recommendations say in the OV note. He has been using Ketoconazole cream and Tacrolimus ointment at the groin. I suggested he stop the Ketoconazole cream because the not does not indicate to continue using it. I offered them an appointment and scheduled 2 appts because she will check with her husband to see which one is best for him.She also asked for a refill of the Tacrolimus which I will send in.

## 2022-12-11 NOTE — Telephone Encounter (Signed)
Patient  is on two medications: Tacrolimus 0.1% and Ketoconazol 2% patient uses them and then it stops working. Patient wife advises that its not doing what it needs to do, she would like to see about alternative and wanted to see if he could come in earlier. At the moment next available date is 7/25. Best number to call back is [phone], this is his wife Ms. Kandis Mannan.

## 2022-12-18 ENCOUNTER — Ambulatory Visit (INDEPENDENT_AMBULATORY_CARE_PROVIDER_SITE_OTHER): Payer: BC Managed Care – PPO | Admitting: Psychiatry

## 2022-12-18 ENCOUNTER — Ambulatory Visit: Payer: BC Managed Care – PPO | Admitting: Dermatology

## 2022-12-18 DIAGNOSIS — F319 Bipolar disorder, unspecified: Secondary | ICD-10-CM

## 2022-12-18 NOTE — Progress Notes (Signed)
[facility] Counselor/Therapist Progress Note  Patient ID: Andrew Glover, MRN: [id number],    Date: 12/18/2022  Time Spent: 55 minutes                                                                                 Treatment Type: Individual Therapy  Reported Symptoms: anxiety, some marital issues (improving)  Mental Status Exam:  Appearance:   Casual     Behavior:  Appropriate, Sharing, and Motivated  Motor:  Normal  Speech/Language:   Clear and Coherent  Affect:  anxious  Mood:  anxious  Thought process:  goal directed  Thought content:    WNL  Sensory/Perceptual disturbances:    WNL  Orientation:  oriented to person, place, time/date, situation, day of week, month of year, year, and stated date of December 18, 2022  Attention:  Fair  Concentration:  Good and Fair  Memory:  WNL  Fund of knowledge:   Good  Insight:    Good and Fair  Judgment:   Good  Impulse Control:  Good   Risk Assessment: Danger to Self:  No Self-injurious Behavior: No Danger to Others: No Duty to Warn:no Physical Aggression / Violence:No  Access to Firearms a concern: No  Gang Involvement:No   Subjective:   Patient in session today reporting stress, anxiety mostly work and family related. Patient does report today some improvement in his temper, "watching what I say and trying to watch what I think and dwell on."  Explained what he is referring to and how it has impacted his life in some positive ways that he realizes he needs to work on with more consistently. Working to Not respond to people and issues so quickly and without much thinking. "Not too many issues at work as I stay busy and focused on my job." States "things better with wife" as they are working more on their marital relationship. Patient much more self-aware and realizing a difference in his responses within the family and how he needs to continue. Temper management improving gradually. Patient reports some improvement in his stress and  anger management, and feeling better about himself. Patient has also quit his alcohol consumption and states he intends to maintain his sobriety, stating "I'm done with alcohol." Reports other positives including trying to walk away from situations that aggravate him rather than having a negative response, trying to have more patience at home, and further his ability to handle anger in healthier ways. Fewer racing thoughts. Notices he is "able to remain calm in more situations that I used to be."  States he intends to make this a habit. Motivation remain quite good and his progress seems to be feeding the motivation and follow through on recommendations from therapy.   Interventions: Cognitive Behavioral Therapy and Ego-Supportive  Reduce overall level, frequency, and intensity of the anxiety so that daily functioning is not impaired. Increase understanding of beliefs and messages that produce the worry and anxiety. Identify and use specific doping strategies for anxiety reduction.   Diagnosis:   ICD-10-CM   1. Bipolar I disorder (HCC)  F31.9      Plan:   Patient today showing good participation  in session as he worked further on some issues within his marriage including temper management, stress/frustrations, being able to "let go", improved communications with wife, and anxiety management.  Is showing some improvement in his attitude, mood, and behavior.  Needs to continue working with goal-directed behaviors to keep moving in a forward direction. Encouraged patient and his practice of more positive behaviors between sessions including: Stay in the present and focused on what he can change or control, staying in contact with supportive people, be outside some each day and walk, recognize and emphasize his strengths, refrain from assuming worst-case scenarios, practice more consistent positive self talk, use of "timeouts" as needed, reduce overthinking and over analyzing, letting go of things from the  past that can hold him back now, practicing improved listening skills to really hear what someone else is saying before he speaks to them, and realize the strength he shows when working with goal-directed behaviors to move in a direction that supports his improved emotional health and outlook.  Self rating scales: (updated 12/18/2022) 1-10 depression scale-3 (relating to family) 1-10 anxiety scale-5 1-10 self-esteem scale-10 1-10 motivation scale-8 1-10 hopefulness scale-7/8  Goal review and progress/challenges noted with patient.  Next appointment within 2 to 3 weeks.   Mathis Fare, LCSW

## 2022-12-21 DIAGNOSIS — S61202A Unspecified open wound of right middle finger without damage to nail, initial encounter: Secondary | ICD-10-CM | POA: Diagnosis not present

## 2022-12-21 DIAGNOSIS — S60940A Unspecified superficial injury of right index finger, initial encounter: Secondary | ICD-10-CM | POA: Diagnosis not present

## 2022-12-21 DIAGNOSIS — S67190A Crushing injury of right index finger, initial encounter: Secondary | ICD-10-CM | POA: Diagnosis not present

## 2022-12-21 DIAGNOSIS — S67192A Crushing injury of right middle finger, initial encounter: Secondary | ICD-10-CM | POA: Diagnosis not present

## 2022-12-28 DIAGNOSIS — G4733 Obstructive sleep apnea (adult) (pediatric): Secondary | ICD-10-CM | POA: Diagnosis not present

## 2023-01-02 ENCOUNTER — Ambulatory Visit (INDEPENDENT_AMBULATORY_CARE_PROVIDER_SITE_OTHER): Payer: BC Managed Care – PPO | Admitting: Dermatology

## 2023-01-02 ENCOUNTER — Encounter: Payer: Self-pay | Admitting: Dermatology

## 2023-01-02 VITALS — BP 133/80 | HR 60

## 2023-01-02 DIAGNOSIS — B356 Tinea cruris: Secondary | ICD-10-CM | POA: Diagnosis not present

## 2023-01-02 NOTE — Progress Notes (Signed)
   Follow-Up Visit   Subjective  Andrew Glover is a 62 y.o. male who presents for the following: rash on groin   Patient present today for follow up visit for rash on groin. Patient was last evaluated on 10/31/22. Patient reports rash is the same as before. If he uses the tacrolimus and antifungul powder rash is comfortable, but if he doesn't use them it burns and is painful.Patient reports no medication changes.   The following portions of the chart were reviewed this encounter and updated as appropriate: medications, allergies, medical history  Review of Systems:  No other skin or systemic complaints except as noted in HPI or Assessment and Plan.  Objective  Well appearing patient in no apparent distress; mood and affect are within normal limits.   A focused examination was performed of the following areas: groin  Relevant exam findings are noted in the Assessment and Plan.  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  Will flared when not continuously using tacrolimus  Treatment Plan: -Continue Antifungal powder daily to prevent recurrence  -Continue Tacrolimus 0.1% cream to affected area bid mixed with zinc when not using compound  -start lidocaine/gabapentin/amitriptiline compound 1-2 times a day  No follow-ups on file.  Owens Shark, CMA, am acting as scribe for [employer], DO.   Documentation: I have reviewed the above documentation for accuracy and completeness, and I agree with the above.  Langston Reusing, DO

## 2023-01-02 NOTE — Patient Instructions (Addendum)
Thank you for visiting the clinic today. I appreciate your dedication to managing your health concerns and am committed to helping you improve your condition.  Based on our discussion and examination today, here are the key instructions and recommendations:  - Continue using Tacrolimus as it helps manage your symptoms effectively without the adverse effects associated with long-term steroid use.  - Compounded Cream Prescription: We will prescribe a new cream containing lidocaine, gabapentin, and amitriptyline to address the nerve sensitivity and discomfort you experience, especially during showers.   - Usage: Apply the cream once or twice daily, preferably in the morning.   - Pharmacy Coordination: [facility] will contact you regarding payment and shipping details for the compounded cream. - Alternative Treatment Option: If the new cream does not provide relief, you may revert to mixing Tacrolimus with zinc oxide for additional soothing effects.  - Follow-Up Appointment: We will schedule a follow-up in three months to assess the effectiveness of the new treatment plan and make any necessary adjustments.  Please feel free to reach out if you have any questions or if your symptoms change before our next scheduled appointment.        Due to recent changes in healthcare laws, you may see results of your pathology and/or laboratory studies on MyChart before the doctors have had a chance to review them. We understand that in some cases there may be results that are confusing or concerning to you. Please understand that not all results are received at the same time and often the doctors may need to interpret multiple results in order to provide you with the best plan of care or course of treatment. Therefore, we ask that you please give [place] 2 business days to thoroughly review all your results before contacting the office for clarification. Should we see a critical lab result, you will be contacted  sooner.   If You Need Anything After Your Visit  If you have any questions or concerns for your doctor, please call our main line at [phone] If no one answers, please leave a voicemail as directed and we will return your call as soon as possible. Messages left after 4 pm will be answered the following business day.   You may also send [place] a message via MyChart. We typically respond to MyChart messages within 1-2 business days.  For prescription refills, please ask your pharmacy to contact our office. Our fax number is [phone].  If you have an urgent issue when the clinic is closed that cannot wait until the next business day, you can page your doctor at the number below.    Please note that while we do our best to be available for urgent issues outside of office hours, we are not available 24/7.   If you have an urgent issue and are unable to reach [place], you may choose to seek medical care at your doctor's office, retail clinic, urgent care center, or emergency room.  If you have a medical emergency, please immediately call 911 or go to the emergency department. In the event of inclement weather, please call our main line at [phone] for an update on the status of any delays or closures.  Dermatology Medication Tips: Please keep the boxes that topical medications come in in order to help keep track of the instructions about where and how to use these. Pharmacies typically print the medication instructions only on the boxes and not directly on the medication tubes.   If your medication is  too expensive, please contact our office at [phone] or send [place] a message through MyChart.   We are unable to tell what your co-pay for medications will be in advance as this is different depending on your insurance coverage. However, we may be able to find a substitute medication at lower cost or fill out paperwork to get insurance to cover a needed medication.   If a prior authorization is  required to get your medication covered by your insurance company, please allow [place] 1-2 business days to complete this process.  Drug prices often vary depending on where the prescription is filled and some pharmacies may offer cheaper prices.  The website www.goodrx.com contains coupons for medications through different pharmacies. The prices here do not account for what the cost may be with help from insurance (it may be cheaper with your insurance), but the website can give you the price if you did not use any insurance.  - You can print the associated coupon and take it with your prescription to the pharmacy.  - You may also stop by our office during regular business hours and pick up a GoodRx coupon card.  - If you need your prescription sent electronically to a different pharmacy, notify our office through [facility] or by phone at [phone]

## 2023-01-03 ENCOUNTER — Ambulatory Visit (INDEPENDENT_AMBULATORY_CARE_PROVIDER_SITE_OTHER): Payer: BC Managed Care – PPO | Admitting: Psychiatry

## 2023-01-03 ENCOUNTER — Other Ambulatory Visit: Payer: Self-pay | Admitting: Dermatology

## 2023-01-03 DIAGNOSIS — F319 Bipolar disorder, unspecified: Secondary | ICD-10-CM

## 2023-01-03 NOTE — Progress Notes (Signed)
[facility] Counselor/Therapist Progress Note  Patient ID: Andrew Glover, MRN: [id number],    Date: 01/03/2023  Time Spent: 53 minutes   Treatment Type: Individual Therapy  Reported Symptoms:  anxiety, frustration, anger re: marital/family issues  Mental Status Exam:  Appearance:   Casual     Behavior:  Appropriate, Sharing, and Motivated  Motor:  Normal  Speech/Language:   Clear and Coherent  Affect:  Anxious, some irritability  Mood:  anxious and some irritability  Thought process:  goal directed  Thought content:    WNL  Sensory/Perceptual disturbances:    WNL  Orientation:  oriented to person, place, time/date, situation, day of week, month of year, year, and stated date of January 03, 2023  Attention:  Good  Concentration:  Good  Memory:  WNL  Fund of knowledge:   Good  Insight:    Good and Fair  Judgment:   Good  Impulse Control:  Good and Fair   Risk Assessment: Danger to Self:  No Self-injurious Behavior: No Danger to Others: No Duty to Warn:no Physical Aggression / Violence:No  Access to Firearms a concern: No  Gang Involvement:No   Subjective: Patient today is session and reports anxiety, stress, anger, and frustration, all mostly related to work and family issues. Needed session today to talk further about his part in marital issues that are currently ongoing. Needing to pay more attention to "how I say what I say" and practiced this some in session today. Also working anger and frustration regarding family issues that continue and sometimes escalate. Being more aware of some of his ways of speaking and trying to not escalate in his tone or volume. Working on Microbiologist and "choosing my words carefully especially when I'm angry or irritated." Has shown some progress in his patience, not reacting as impulsively when angered or frustrated. Feeling better about his being able to defuse situations, and needs to continues working on this.  Listening has also  improved. Continues to note the positives in addition to what he perceives as negatives. Showing more effort to respond in positive ways, and shared several examples today.   Interventions: Cognitive Behavioral Therapy and Ego-Supportive  Reduce overall level, frequency, and intensity of the anxiety so that daily functioning is not impaired. Increase understanding of beliefs and messages that produce the worry and anxiety. Identify and use specific doping strategies for anxiety reduction.    Diagnosis:   ICD-10-CM   1. Bipolar I disorder (HCC)  F31.9      Plan:   Patient in today and showing good motivation and participation in session as he continues to work on his stress/frustrations, temper management, improved communications especially within the family, being able to "let go", and anxiety management especially in relationship with his wife and family.  Patient is showing progress and needs to continue his work with goal-directed behaviors to keep moving in a forward direction. Encouraged patient in his practice of more positive behaviors between sessions including: Stay in the present and focused on what he can change or control, staying in contact with supportive people, be outside some each day and walk, recognize and emphasize his strengths, refrain from assuming worst-case scenarios, practice more consistent positive self talk, use of "timeouts" is needed, reduce overthinking and over analyzing, letting go of things from the past that can hold him back now, practicing improved listening skills to really hear what someone else is saying before he responds to them, and recognize the strength  he shows when working with goal-directed behaviors to move in a direction that supports his improved emotional health and overall wellbeing.  Self rating scales: (updated 01/03/2023) 1-10 depression scale-3 (relating to family) 1-10 anxiety scale-5 1-10 self-esteem scale-10 1-10 motivation scale-7 1-10  hopefulness scale-7/8  Goal review and progress/challenges noted with patient.  Next appointment within 2 to 3 weeks.   Mathis Fare, LCSW

## 2023-01-22 ENCOUNTER — Ambulatory Visit (INDEPENDENT_AMBULATORY_CARE_PROVIDER_SITE_OTHER): Payer: BC Managed Care – PPO | Admitting: Psychiatry

## 2023-01-22 DIAGNOSIS — F411 Generalized anxiety disorder: Secondary | ICD-10-CM

## 2023-01-22 NOTE — Progress Notes (Signed)
[facility] Counselor/Therapist Progress Note  Patient ID: Andrew Glover, MRN: [id number],    Date: 01/22/2023  Time Spent: 53 minutes   Treatment Type: Individual Therapy  Reported Symptoms: anxiety, anger  Mental Status Exam:  Appearance:   Casual and Neat     Behavior:  Appropriate, Sharing, and Motivated  Motor:  Normal  Speech/Language:   Clear and Coherent  Affect:  Anxious, some anger  Mood:  angry and anxious  Thought process:  goal directed  Thought content:    WNL  Sensory/Perceptual disturbances:    WNL  Orientation:  oriented to person, place, time/date, situation, day of week, month of year, year, and stated date of Aug. 12, 2024  Attention:  Good  Concentration:  Good  Memory:  WNL  Fund of knowledge:   Good  Insight:    Good and Fair  Judgment:   Good  Impulse Control:  Good, Fair, and but improving more recently   Risk Assessment: Danger to Self:  No Self-injurious Behavior: No Danger to Others: No Duty to Warn:no Physical Aggression / Violence:No  Access to Firearms a concern: No  Gang Involvement:No   Subjective:   Patient in for session today reporting anxiety, frustration, and some anger. Symptoms mostly related to job and some work he is doing outside of his job. Reports his stronger symptom to be anger, especially re: some business issues with an individual outside the family. Patient able to process what all happened in encounter with other person, which was all verbal, not physical. Admits he was more loud and emphatic with person, but was able to  get himself more together and calm down, as he was able to remain more calm in appt today sharing how he has been more stressed recently. Shared several ways that he is realizing he is improving some as he is better at managing anger, better able to defuse situations, thinking about what I do or say before and if I realize I'm interacting too negatively then I try to think about it more before actins or  responding. Working on more quickly removing himself from volatile situations at work and home and this he is finding helpful. Shared some of the progress he is feeling in himself and feeling more encouraged and hopeful. Motivated to continue. Wife has noticed some progress and commented on it to patient. Working especially on managing his anger, not being as impulsive, and following through on newer behavior skills. States he is listening  better and tending to  be more of a positive person overall.   Interventions: Cognitive Behavioral Therapy and Ego-Supportive  Reduce overall level, frequency, and intensity of the anxiety so that daily functioning is not impaired. Increase understanding of beliefs and messages that produce the worry and anxiety. Identify and use specific doping strategies for anxiety reduction.   Diagnosis:   ICD-10-CM   1. Generalized anxiety disorder  F41.1      Plan:  Patient in for session today showing good participation and motivation as he continues working on specific behaviors of stress/frustrations, temper management in healthier ways, improved communications, being able to let go more readily of things that get in his way from moving forward, and anxiety management particularly within the family and work context.  He has made progress and needs to continue working with goal-directed behaviors to move in a forward direction.  Reminded and encouraged patient and practicing more positive behaviors between sessions including: Remain in the present and focused  on what he can control or change, remain in contact with supportive people, recognize and emphasize his strengths, refrain from assuming worst-case scenarios, use of positive self talk more consistently, use of "timeouts" as needed, reduce overthinking and over analyzing, letting go of things from the past that can hold him back now, practice improved listening skills to really hear what someone else is saying to him  before he responds, and realize the strength he shows working with goal-directed behaviors to move in a direction that supports his improved emotional health and outlook.  Goal review and progress/challenges noted with patient.  Next appointment within 2 to 3 weeks.   Mathis Fare, LCSW

## 2023-01-28 DIAGNOSIS — G4733 Obstructive sleep apnea (adult) (pediatric): Secondary | ICD-10-CM | POA: Diagnosis not present

## 2023-02-07 ENCOUNTER — Telehealth: Payer: Self-pay

## 2023-02-07 NOTE — Telephone Encounter (Signed)
Wife called into office stating Mr. Schrader itching has gotten worse. She states he says he is itching all night and all day while at work. Wife could not confirm or deny if pt has any opened wounds or skin breakage at the groin. I attempted to get additional details about what patient bathes in and exactly how he is using each topical medication, his wife replied " He is using everything you guys recommended". She could not give me specific items as to what he is currently using and how often. She stated he stopped using the Compound medication from [facility] because it was causing severe burning to the point where he immediately wiped it off. I asked his wife if the burning was the same as when he was previously seen in the office prior to the medication. She stated she did not know and she only wanted a stronger prescription for the itch. I advised that Dr. Onalee Hua was out of the office until September 3 and she would need to review prior to prescribing any additional topicals. Pt's wife then requested an URGENT appt for next week and I advised I would have to review our openings with Dr. Onalee Hua prior to adding him on the schedule. I did recommend trying otc Hydrocortisone along with Tacrolimus both 2 times a day and I also recommended trying a Antihistamine to take daily to help with the itch. I also recommended trying Cerave Anti Itch lotion. Wife stated she will see if pt would like to go to UC but if not she will wait for a call back on Tuesday.

## 2023-02-08 ENCOUNTER — Ambulatory Visit (INDEPENDENT_AMBULATORY_CARE_PROVIDER_SITE_OTHER): Payer: BC Managed Care – PPO | Admitting: Psychiatry

## 2023-02-08 DIAGNOSIS — F319 Bipolar disorder, unspecified: Secondary | ICD-10-CM | POA: Diagnosis not present

## 2023-02-08 NOTE — Progress Notes (Signed)
[facility] Counselor/Therapist Progress Note  Patient ID: Andrew Glover, MRN: [id number],    Date: 02/08/2023  Time Spent: 55 minutes   Treatment Type: Individual Therapy  Reported Symptoms: anxiety, "sometimes have the jitters", anger decreased   Mental Status Exam:  Appearance:   Casual     Behavior:  Appropriate, Sharing, and Motivated  Motor:  Normal  Speech/Language:   Clear and Coherent  Affect:  anxious  Mood:  anxious  Thought process:  goal directed  Thought content:    WNL  Sensory/Perceptual disturbances:    WNL  Orientation:  oriented to person, place, time/date, situation, day of week, month of year, year, and stated date of Aug. 29, 2024  Attention:  Good  Concentration:  Good  Memory:  WNL  Fund of knowledge:   Good  Insight:    Good  Judgment:   Good  Impulse Control:  Good and Fair   Risk Assessment: Danger to Self:  No Self-injurious Behavior: No Danger to Others: No Duty to Warn:no Physical Aggression / Violence:No  Access to Firearms a concern: No  Gang Involvement:No   Subjective:   Patient in session today and reports some progress he feels as a result of working in therapy including: being calmer in talking to others, thinking before he speaks, sometime restraining from speaking until he is calmer, self-calming skills, and some de-fusing of situations, "but this is all a work in progress." Also reportedly doing some better in his marital communication and relationship. Working further today on managing his anxiety, frustration, anger, and Engineer, water. Practiced verbal and listening skills in session today, that he plans use at home and at other places as appropriate. Less anger observed today. Looked at non-verbal behaviors also and ways to make some changes. Overall has been managing anger some more effectively and needs to continue working on this. Shared how he and wife are noticing some of the changes they both have made, and  that helps their motivation. Listening skills continue to improve.   Interventions: Cognitive Behavioral Therapy and Ego-Supportive  Reduce overall level, frequency, and intensity of the anxiety so that daily functioning is not impaired. Increase understanding of beliefs and messages that produce the worry and anxiety. Identify and use specific doping strategies for anxiety reduction.   Diagnosis:   ICD-10-CM   1. Bipolar I disorder (HCC)  F31.9      Plan:   Patient today showing good motivation and participation in session as he worked further on marital and interpersonal relationship and communication skills, including working to diffuse situations as needed.  Temper management is getting better, handling of stress/frustrations is also improving.  Anxiety and anger management improving. Patient has made progress and is motivated to continue working with goal-directed behaviors to keep moving in a forward direction.  Encouraged him to continue practicing more positive behaviors between sessions including stay in the present and focused on what he can change, emphasize his strengths, stay in contact with supportive people, reduce overthinking and over analyzing, letting go of things from the past that can hold him back now, practice his improve listening skills to really hear what someone else is saying to him before he responds, and recognize the strength he shows working with goal-directed behaviors to move in a direction that supports his improved emotional health and his overall wellbeing.  Goal review and progress/challenges noted with patient.  Next appointment within 3 weeks.   Mathis Fare, LCSW

## 2023-02-15 ENCOUNTER — Other Ambulatory Visit: Payer: Self-pay

## 2023-02-15 DIAGNOSIS — B356 Tinea cruris: Secondary | ICD-10-CM

## 2023-02-15 MED ORDER — JUBLIA 10 % EX SOLN: 1.0000 | Freq: Every day | CUTANEOUS | 6 refills | Status: DC

## 2023-02-22 ENCOUNTER — Ambulatory Visit: Payer: BC Managed Care – PPO | Admitting: Adult Health

## 2023-02-26 ENCOUNTER — Ambulatory Visit (INDEPENDENT_AMBULATORY_CARE_PROVIDER_SITE_OTHER): Payer: BC Managed Care – PPO | Admitting: Psychiatry

## 2023-02-26 DIAGNOSIS — F319 Bipolar disorder, unspecified: Secondary | ICD-10-CM | POA: Diagnosis not present

## 2023-02-26 NOTE — Progress Notes (Signed)
[facility] Counselor/Therapist Progress Note  Patient ID: Andrew Glover, MRN: [id number],    Date: 02/26/2023  Time Spent: 55 minutes   Treatment Type: Individual Therapy  Reported Symptoms: anxiety, some irritability   Mental Status Exam:  Appearance:   Casual     Behavior:  Appropriate, Sharing, and Motivated  Motor:  Normal  Speech/Language:   Clear and Coherent  Affect:  anxiety  Mood:  anxious  Thought process:  goal directed  Thought content:    WNL  Sensory/Perceptual disturbances:    WNL  Orientation:  oriented to person, place, time/date, situation, day of week, month of year, year, and stated date of Sept. 16, 2024  Attention:  Good  Concentration:  Good and Fair  Memory:  WNL  Fund of knowledge:   Good  Insight:    Good and Fair  Judgment:   Good  Impulse Control:  Good   Risk Assessment: Danger to Self:  No Self-injurious Behavior: No Danger to Others: No Duty to Warn:no Physical Aggression / Violence:No  Access to Firearms a concern: No  Gang Involvement:No   Subjective:  Patient participating actively in session today reporting anxiety and some irritability mostly related to family and work. Is continuing to see some progress in how he interacts better with wife especially in handling differences, and shared several examples of some of his progress. Able to laugh at himself at times and he agrees that is healthy for him and helps him cope with stressful situations. Shared that he remains committed to his therapy goals and sees himself as making progress including: his work on becoming more patient, not speaking until he has calmed down, practicing self-calming skills that are helpful to him, able to de-fuse some situations, and listening skills have definitely improved along with his patience which he continues to work on. Handling anger and frustrations better and this is a work in progress. Emphasized patient's skills verbally, and in listening, and in  being able to calm himself down when someone has done or said something to hurt or anger patient. He was able to process how he has experienced some personal growth in areas noted above in working on his treatment goals. Anger continues to be less, while motivation remains good.   Interventions: Cognitive Behavioral Therapy  Reduce overall level, frequency, and intensity of the anxiety so that daily functioning is not impaired. Increase understanding of beliefs and messages that produce the worry and anxiety. Identify and use specific doping strategies for anxiety reduction.   Diagnosis:   ICD-10-CM   1. Bipolar I disorder (HCC)  F31.9      Plan:  Patient in for session today motivated and actively participating in continued work on his goals and also in reviewing some of his progress made thus far. 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 Patient is making progress and needs to continue working with goal directed behaviors to keep moving in a positive direction. Encouraged patient in his continuing to practice more positive behaviors between sessions including remain in the present and focused on what he can change, emphasize his strengths, remain in contact with supportive people, reduce overthinking and over analyzing, letting go of things from the past that can hold him back now, practice his improved listening skills to really hear what someone else is saying before he responds, and realize the strength he shows working with goal-directed behaviors to  move in a direction that supports his improved emotional health and outlook into the future.  Goal review and progress/challenges noted with patient.  Next appointment within 3 weeks.   Mathis Fare, LCSW

## 2023-02-27 DIAGNOSIS — G4733 Obstructive sleep apnea (adult) (pediatric): Secondary | ICD-10-CM | POA: Diagnosis not present

## 2023-03-02 DIAGNOSIS — G4733 Obstructive sleep apnea (adult) (pediatric): Secondary | ICD-10-CM | POA: Diagnosis not present

## 2023-03-08 ENCOUNTER — Ambulatory Visit (INDEPENDENT_AMBULATORY_CARE_PROVIDER_SITE_OTHER): Payer: BC Managed Care – PPO | Admitting: Adult Health

## 2023-03-08 DIAGNOSIS — G4700 Insomnia, unspecified: Secondary | ICD-10-CM

## 2023-03-08 DIAGNOSIS — F411 Generalized anxiety disorder: Secondary | ICD-10-CM | POA: Diagnosis not present

## 2023-03-08 DIAGNOSIS — F319 Bipolar disorder, unspecified: Secondary | ICD-10-CM | POA: Diagnosis not present

## 2023-03-08 NOTE — Progress Notes (Signed)
Andrew Glover [id number] 1960-09-13 62 y.o.  Subjective:   Patient ID:  Andrew Glover is a 62 y.o. (DOB 1961-01-23) male.  Chief Complaint: No chief complaint on file.   HPI Andrew Glover presents to the office today for follow-up of BPD-1, GAD, and insomnia.  Describes mood today as "ok". Pleasant. Denies tearfulness. Mood symptoms - denies depression and anxiety. Reports irritability at times. Denies panic attacks. Reports some over thinking. Denies obsessive thoughts and acts. Mood is more consistent with Lamictal. Stating "I feel like I'm doing alright". Does report some concerns of restless legs - reports a history of antipsychotic use and is uncertain of cause. Some concerns it may be the Lamictal and is willing to lower the dose for the next months. Stable interest and motivation. Taking medications as prescribed.  Energy levels good. Active, does not have a regular exercise routine.  Enjoys some usual interests and activities. Married. Lives with wife. Has 3 step children. Spending time with family and friends. Appetite adequate. Weight 205 pounds. Sleeps well most nights. Averages 7 hours. Focus and concentration stable. Completing tasks. Managing aspects of household. Works at [employer] x 44 years. Denies SI or HI.  Denies AH or VH. Denies self harm. Stopped THC and Alcohol x 6 months ago.  Previous medication trials: Abilify, Lamictal, Latuda, Lithium, Rexulti, Trileptal, Geodon   GAD-7    Flowsheet Row Office Visit from 09/20/2021 in [facility] at [employer] Visit from 12/16/2020 in [facility] at [employer]  Total GAD-7 Score 1 10      PHQ2-9    Flowsheet Row Office Visit from 09/20/2021 in [facility] Visit from 03/18/2021 in [facility] Visit from 12/16/2020 in [facility] [employer]  PHQ-2 Total  Score 0 0 0  PHQ-9 Total Score 0 -- 10        Review of Systems:  Review of Systems  Musculoskeletal:  Negative for gait problem.  Neurological:  Negative for tremors.  Psychiatric/Behavioral:         Please refer to HPI    Medications: I have reviewed the patient's current medications.  Current Outpatient Medications  Medication Sig Dispense Refill  . ascorbic acid (VITAMIN C) 1000 MG tablet Take 1,000 mg by mouth daily.    Marland Kitchen aspirin 81 MG EC tablet Take 1 tablet by mouth every morning.    Marland Kitchen atenolol-chlorthalidone (TENORETIC) 50-25 MG tablet Take 1 tablet by mouth daily. 30 tablet 1  . atorvastatin (LIPITOR) 40 MG tablet Take 1 tablet (40 mg total) by mouth daily. Take 40 mg by mouth daily. 30 tablet 1  . cetirizine (ZYRTEC) 10 MG tablet Take 10 mg by mouth daily.    . clobetasol cream (TEMOVATE) 0.05 % Apply 1 Application topically as needed. (Patient not taking: Reported on 09/13/2022)    . Efinaconazole (JUBLIA) 10 % SOLN Apply 1 Application topically daily. TO AFFECTED TOES FOR SIX TO EIGHT MONTHS 8 mL 6  . ergocalciferol (VITAMIN D2) 1.25 MG (50000 UT) capsule Take by mouth.    Marland Kitchen ketoconazole (NIZORAL) 2 % cream Apply 1 Application topically daily. TO BOTTOMS OF FEET AND IN BETWEEN TOES 45 g 3  . lamoTRIgine (LAMICTAL) 100 MG tablet Take 1 tablet (100 mg total) by mouth 2 (two) times daily. 180 tablet 1  . losartan (COZAAR) 100 MG tablet Take 1 tablet (100 mg total) by  mouth daily. 30 tablet 1  . Multiple Vitamin (MULTIVITAMIN PO) Take 1 tablet by mouth daily.    . tacrolimus (PROTOPIC) 0.1 % ointment APPLY TOPICALLY 2 (TWO) TIMES DAILY. APPLY TO GROIN TWICE DAILY 100 g 2  . triamcinolone cream (KENALOG) 0.1 % Apply 1 application topically 2 (two) times daily. (Patient taking differently: Apply 1 application  topically as needed.) 30 g 0   No current facility-administered medications for this visit.    Medication Side Effects: None  Allergies:  Allergies  Allergen  Reactions  . Aripiprazole     Other Reaction(s): akathisias, weight gain on 5 mg daily (43 lbs over 6 yrs.Only 6 lbs and no akathisias over 2 years.  . Brexpiprazole     Other Reaction(s): itchy ankles, not effective on 1.5 mg qd  . Lamotrigine     Other Reaction(s): no SE, not effective  . Lithium     Other Reaction(s): no SE, not effective  . Lurasidone     Other Reaction(s): increased appetite, not as "laid back:  . Quetiapine     Other Reaction(s): sleepy, increased weight, not effective  . Sulfa Antibiotics Hives and Itching  . Ziprasidone     Other Reaction(s): drowsiness on 40 mg twice daily  . Codeine Itching and Rash  . Sulfamethoxazole Rash  . Sulfamethoxazole-Trimethoprim Rash    Past Medical History:  Diagnosis Date  . Bipolar 1 disorder (HCC)   . Hyperlipidemia   . Hypertension     Past Medical History, Surgical history, Social history, and Family history were reviewed and updated as appropriate.   Please see review of systems for further details on the patient's review from today.   Objective:   Physical Exam:  There were no vitals taken for this visit.  Physical Exam Constitutional:      General: He is not in acute distress. Musculoskeletal:        General: No deformity.  Neurological:     Mental Status: He is alert and oriented to person, place, and time.     Coordination: Coordination normal.  Psychiatric:        Attention and Perception: Attention and perception normal. He does not perceive auditory or visual hallucinations.        Mood and Affect: Affect is not labile, blunt, angry or inappropriate.        Speech: Speech normal.        Behavior: Behavior normal.        Thought Content: Thought content normal. Thought content is not paranoid or delusional. Thought content does not include homicidal or suicidal ideation. Thought content does not include homicidal or suicidal plan.        Cognition and Memory: Cognition and memory normal.         Judgment: Judgment normal.     Comments: Insight intact    Lab Review:     Component Value Date/Time   NA 139 03/31/2021 1321   K 3.6 03/31/2021 1321   CL 98 03/31/2021 1321   CO2 31 03/31/2021 1321   GLUCOSE 110 (H) 03/31/2021 1321   BUN 11 03/31/2021 1321   CREATININE 1.04 03/31/2021 1321   CALCIUM 9.4 03/31/2021 1321   PROT 6.5 03/31/2021 1321   ALBUMIN 4.5 03/31/2021 1321   AST 22 03/31/2021 1321   ALT 33 03/31/2021 1321   ALKPHOS 75 03/31/2021 1321   BILITOT 1.0 03/31/2021 1321   GFRNONAA >90 03/20/2011 1955   GFRAA >90 03/20/2011 1955  Component Value Date/Time   WBC 9.2 03/31/2021 1321   RBC 5.05 03/31/2021 1321   HGB 15.9 03/31/2021 1321   HCT 45.8 03/31/2021 1321   PLT 193.0 03/31/2021 1321   MCV 90.7 03/31/2021 1321   MCH 31.0 03/20/2011 1955   MCHC 34.7 03/31/2021 1321   RDW 13.6 03/31/2021 1321    No results found for: "POCLITH", "LITHIUM"   No results found for: "PHENYTOIN", "PHENOBARB", "VALPROATE", "CBMZ"   .res Assessment: Plan:    Plan:  PDMP reviewed  Working with therapist - Rockne Menghini  He and wife working with marriage counselor.  Lamictal 100mg  BID - will decrease to 50mg  at hs - reporting restless legs - uncertain if from previous antipsychotics responsible.  RTC 4 weeks  Patient advised to contact office with any questions, adverse effects, or acute worsening in signs and symptoms.  Counseled patient regarding potential benefits, risks, and side effects of Lamictal to include potential risk of Stevens-Johnson syndrome. Advised patient to stop taking Lamictal and contact office immediately if rash develops and to seek urgent medical attention if rash is severe and/or spreading quickly.   There are no diagnoses linked to this encounter.   Please see After Visit Summary for patient specific instructions.  Future Appointments  Date Time Provider Department Center  03/08/2023  5:00 PM Trae Bovenzi, Thereasa Solo, NP [facility] None   03/19/2023  5:00 PM Mathis Fare, LCSW [facility] None  04/04/2023  4:15 PM Terri Piedra, DO [facility] None  04/10/2023  5:00 PM Mathis Fare, LCSW [facility] None  05/01/2023  4:15 PM Terri Piedra, DO [facility] None  05/01/2023  5:00 PM Mathis Fare, LCSW [facility] None  05/28/2023 10:00 AM Mathis Fare, LCSW [facility] None  06/18/2023  5:00 PM Mathis Fare, LCSW [facility] None    No orders of the defined types were placed in this encounter.   -------------------------------

## 2023-03-09 ENCOUNTER — Telehealth: Payer: Self-pay | Admitting: Adult Health

## 2023-03-09 ENCOUNTER — Encounter: Payer: Self-pay | Admitting: Adult Health

## 2023-03-09 NOTE — Telephone Encounter (Signed)
Wife called and wanted gina to know that Andrew Glover is twitching all over his body not just on his legs.

## 2023-03-13 NOTE — Telephone Encounter (Signed)
The noted change was made recently. Wife said he is still moving around, but reports it can be all body parts and not just his legs.   Lamictal 100mg  BID - will decrease to 50mg  at hs - reporting restless legs - uncertain if from previous antipsychotics responsible.

## 2023-03-15 NOTE — Telephone Encounter (Signed)
Per last visit patient was supposed to reduce Lamictal dose from 100 BID to 50 mg at hs. Wife said she didn't think he had reduced it. Will have him reduce it, give it a week, and let us know how he is going.

## 2023-03-16 ENCOUNTER — Emergency Department (HOSPITAL_BASED_OUTPATIENT_CLINIC_OR_DEPARTMENT_OTHER)
Admission: EM | Admit: 2023-03-16 | Discharge: 2023-03-16 | Disposition: A | Discharge status: Home / Self Care | Payer: BC Managed Care – PPO | Attending: Emergency Medicine | Admitting: Emergency Medicine

## 2023-03-16 ENCOUNTER — Encounter (HOSPITAL_BASED_OUTPATIENT_CLINIC_OR_DEPARTMENT_OTHER): Payer: Self-pay

## 2023-03-16 DIAGNOSIS — R7309 Other abnormal glucose: Secondary | ICD-10-CM | POA: Diagnosis not present

## 2023-03-16 DIAGNOSIS — Z7982 Long term (current) use of aspirin: Secondary | ICD-10-CM | POA: Diagnosis not present

## 2023-03-16 DIAGNOSIS — Z79899 Other long term (current) drug therapy: Secondary | ICD-10-CM | POA: Diagnosis not present

## 2023-03-16 DIAGNOSIS — E1165 Type 2 diabetes mellitus with hyperglycemia: Secondary | ICD-10-CM | POA: Insufficient documentation

## 2023-03-16 DIAGNOSIS — R519 Headache, unspecified: Secondary | ICD-10-CM | POA: Insufficient documentation

## 2023-03-16 DIAGNOSIS — I10 Essential (primary) hypertension: Secondary | ICD-10-CM | POA: Insufficient documentation

## 2023-03-16 DIAGNOSIS — R739 Hyperglycemia, unspecified: Secondary | ICD-10-CM | POA: Diagnosis not present

## 2023-03-16 LAB — URINALYSIS, ROUTINE W REFLEX MICROSCOPIC
Bilirubin Urine: NEGATIVE
Glucose, UA: NEGATIVE mg/dL
Hgb urine dipstick: NEGATIVE
Ketones, ur: NEGATIVE mg/dL
Leukocytes,Ua: NEGATIVE
Nitrite: NEGATIVE
Protein, ur: NEGATIVE mg/dL
Specific Gravity, Urine: 1.023 (ref 1.005–1.030)
pH: 6.5 (ref 5.0–8.0)

## 2023-03-16 LAB — CBC WITH DIFFERENTIAL/PLATELET
Abs Immature Granulocytes: 0.01 10*3/uL (ref 0.00–0.07)
Basophils Absolute: 0 10*3/uL (ref 0.0–0.1)
Basophils Relative: 0 %
Eosinophils Absolute: 0.2 10*3/uL (ref 0.0–0.5)
Eosinophils Relative: 3 %
HCT: 44.2 % (ref 39.0–52.0)
Hemoglobin: 15.8 g/dL (ref 13.0–17.0)
Immature Granulocytes: 0 %
Lymphocytes Relative: 26 %
Lymphs Abs: 2.2 10*3/uL (ref 0.7–4.0)
MCH: 31.2 pg (ref 26.0–34.0)
MCHC: 35.7 g/dL (ref 30.0–36.0)
MCV: 87.2 fL (ref 80.0–100.0)
Monocytes Absolute: 0.6 10*3/uL (ref 0.1–1.0)
Monocytes Relative: 7 %
Neutro Abs: 5.3 10*3/uL (ref 1.7–7.7)
Neutrophils Relative %: 64 %
Platelets: 187 10*3/uL (ref 150–400)
RBC: 5.07 MIL/uL (ref 4.22–5.81)
RDW: 13.4 % (ref 11.5–15.5)
WBC: 8.3 10*3/uL (ref 4.0–10.5)
nRBC: 0 % (ref 0.0–0.2)

## 2023-03-16 LAB — CBG MONITORING, ED: Glucose-Capillary: 181 mg/dL — ABNORMAL HIGH (ref 70–99)

## 2023-03-16 MED ORDER — SODIUM CHLORIDE 0.9 % IV BOLUS
1000.0000 mL | Freq: Once | INTRAVENOUS | Status: AC
  Administered 2023-03-16: 1000 mL via INTRAVENOUS

## 2023-03-16 MED ORDER — ACETAMINOPHEN 325 MG PO TABS
650.0000 mg | ORAL_TABLET | Freq: Once | ORAL | Status: AC
  Administered 2023-03-16: 650 mg via ORAL
  Filled 2023-03-16: qty 2

## 2023-03-16 NOTE — ED Triage Notes (Signed)
Pt c/o high blood sugar discovered at home- 288. States he was seen at walk-in clinic for same, advised to come to ED for further eval. Pt endorses "slight" HA, "I just felt off all day- weak, tired, had HA." Pt states he is "sure it's not COVID."

## 2023-03-16 NOTE — ED Provider Notes (Signed)
[place] EMERGENCY DEPARTMENT AT [facility] Provider Note   CSN: [id number] Arrival date & time: 03/16/23  1918     History  Chief Complaint  Patient presents with   Hyperglycemia   Headache    Andrew Glover is a 62 y.o. male.  With a past medical history of hypertension and prediabetes who presents to the ED for hyperglycemia.  Patient was seen recently by his primary care doctor and found to be hyperglycemic.  He was previously prediabetic, then diabetic and was able to lose some weight and went back to prediabetes.  His PCP was concerned he is meeting criteria for diabetes again.  He has had repeated measures over 200s and is not on any metformin or other diabetic regimen.  Patient states he has been having headaches, urinating more and feeling fatigued.  His PCP could not get him into the office today so directed him to an urgent care center but urgent care was unable to "do the test" and he was sent here.  No nausea vomiting fevers chills chest pain or shortness of breath.   Hyperglycemia Headache      Home Medications Prior to Admission medications   Medication Sig Start Date End Date Taking? Authorizing Provider  ascorbic acid (VITAMIN C) 1000 MG tablet Take 1,000 mg by mouth daily.    [provider]  aspirin 81 MG EC tablet Take 1 tablet by mouth every morning.    [provider]  atenolol-chlorthalidone (TENORETIC) 50-25 MG tablet Take 1 tablet by mouth daily. 09/20/21   Mliss Sax, MD  atorvastatin (LIPITOR) 40 MG tablet Take 1 tablet (40 mg total) by mouth daily. Take 40 mg by mouth daily. 09/20/21   Mliss Sax, MD  cetirizine (ZYRTEC) 10 MG tablet Take 10 mg by mouth daily.    [provider]  clobetasol cream (TEMOVATE) 0.05 % Apply 1 Application topically as needed. Patient not taking: Reported on 09/13/2022    [provider]  Efinaconazole (JUBLIA) 10 % SOLN Apply 1 Application topically daily. TO  AFFECTED TOES FOR SIX TO EIGHT MONTHS 02/15/23   Terri Piedra, DO  ergocalciferol (VITAMIN D2) 1.25 MG (50000 UT) capsule Take by mouth.    [provider]  lamoTRIgine (LAMICTAL) 100 MG tablet Take 1 tablet (100 mg total) by mouth 2 (two) times daily. 11/21/22   Mozingo, Thereasa Solo, NP  losartan (COZAAR) 100 MG tablet Take 1 tablet (100 mg total) by mouth daily. 09/20/21   Mliss Sax, MD  Multiple Vitamin (MULTIVITAMIN PO) Take 1 tablet by mouth daily.    [provider]  tacrolimus (PROTOPIC) 0.1 % ointment APPLY TOPICALLY 2 (TWO) TIMES DAILY. APPLY TO GROIN TWICE DAILY 01/04/23   Terri Piedra, DO  triamcinolone cream (KENALOG) 0.1 % Apply 1 application topically 2 (two) times daily. Patient taking differently: Apply 1 application  topically as needed. 12/16/20   Mliss Sax, MD      Allergies    Aripiprazole, Brexpiprazole, Lamotrigine, Lithium, Lurasidone, Quetiapine, Sulfa antibiotics, Ziprasidone, Codeine, Sulfamethoxazole, and Sulfamethoxazole-trimethoprim    Review of Systems   Review of Systems  Neurological:  Positive for headaches.    Physical Exam Updated Vital Signs BP (!) 140/72 (BP Location: Right Arm)   Pulse 72   Temp 98 F (36.7 C) (Oral)   Resp 18   SpO2 97%  Physical Exam Vitals and nursing note reviewed.  HENT:     Head: Normocephalic and atraumatic.  Eyes:  Pupils: Pupils are equal, round, and reactive to light.  Cardiovascular:     Rate and Rhythm: Normal rate and regular rhythm.  Pulmonary:     Effort: Pulmonary effort is normal.     Breath sounds: Normal breath sounds.  Abdominal:     Palpations: Abdomen is soft.     Tenderness: There is no abdominal tenderness.  Skin:    General: Skin is warm and dry.  Neurological:     Mental Status: He is alert.  Psychiatric:        Mood and Affect: Mood normal.     ED Results / Procedures / Treatments   Labs (all labs ordered are listed, but only  abnormal results are displayed) Labs Reviewed  COMPREHENSIVE METABOLIC PANEL - Abnormal; Notable for the following components:      Result Value   Glucose, Bld 144 (*)    All other components within normal limits  CBG MONITORING, ED - Abnormal; Notable for the following components:   Glucose-Capillary 181 (*)    All other components within normal limits  CBC WITH DIFFERENTIAL/PLATELET  URINALYSIS, ROUTINE W REFLEX MICROSCOPIC  BETA-HYDROXYBUTYRIC ACID  HEMOGLOBIN A1C    EKG None  Radiology No results found.  Procedures Procedures    Medications Ordered in ED Medications  sodium chloride 0.9 % bolus 1,000 mL ( Intravenous Stopped 03/16/23 2256)  acetaminophen (TYLENOL) tablet 650 mg (650 mg Oral Given 03/16/23 2150)    ED Course/ Medical Decision Making/ A&P Clinical Course as of 03/16/23 2343  Fri Mar 16, 2023  2341 CMP shows glucose of 144 but is otherwise unremarkable.  No significant renal impairment.  CBC unremarkable.  No evidence of UTI or glucosuria.  Hemoglobin A1c is pending.  Patient feels better after fluids and Tylenol here.  He will follow-up with his primary care doctor [MP]    Clinical Course User Index [MP] Royanne Foots, DO                                 Medical Decision Making 62 year old male with history as above directed here given concerns for hyperglycemia.  Was previously prediabetic but able to control his sugars with weight loss and behavioral modification.  PCP is now concerned for persistently elevated blood glucose.  Low suspicion for DKA or HHS.  Blood glucose in the high 100s here.  Given that he is having headaches and feels weak and urinating a lot more, we will provide him with some Tylenol and IV fluids here.  Will obtain basic laboratory workup including hemoglobin A1c for his PCP to follow-up on.  Suspect he can go home later tonight with close PCP follow-up  Amount and/or Complexity of Data Reviewed Labs: ordered.  Risk OTC  drugs.           Final Clinical Impression(s) / ED Diagnoses Final diagnoses:  Hyperglycemia  Nonintractable headache, unspecified chronicity pattern, unspecified headache type    Rx / DC Orders ED Discharge Orders     None         Royanne Foots, DO 03/16/23 2343

## 2023-03-16 NOTE — Discharge Instructions (Signed)
You were seen in the emergency room for high blood sugar Your blood glucose was in the 100s here We gave you fluids and Tylenol which helped you feel little better The rest of your blood work looked okay Your hemoglobin A1c is still pending You will need to follow-up with your primary care doctor early next week to discuss the results of your blood work and perhaps need to start medications for type 2 diabetes Return to the emergency department for severe headaches, or for any other concerns

## 2023-03-17 LAB — COMPREHENSIVE METABOLIC PANEL
ALT: 21 U/L (ref 0–44)
AST: 18 U/L (ref 15–41)
Albumin: 4.2 g/dL (ref 3.5–5.0)
Alkaline Phosphatase: 78 U/L (ref 38–126)
Anion gap: 7 (ref 5–15)
BUN: 19 mg/dL (ref 8–23)
CO2: 29 mmol/L (ref 22–32)
Calcium: 9.2 mg/dL (ref 8.9–10.3)
Chloride: 104 mmol/L (ref 98–111)
Creatinine, Ser: 0.94 mg/dL (ref 0.61–1.24)
GFR, Estimated: >60 mL/min (ref >60)
Glucose, Bld: 144 mg/dL — ABNORMAL HIGH (ref 70–99)
Potassium: 3.7 mmol/L (ref 3.5–5.1)
Sodium: 140 mmol/L (ref 135–145)
Total Bilirubin: 0.5 mg/dL (ref 0.3–1.2)
Total Protein: 6.5 g/dL (ref 6.5–8.1)

## 2023-03-17 LAB — BETA-HYDROXYBUTYRIC ACID: Beta-Hydroxybutyric Acid: 0.07 mmol/L (ref 0.05–0.27)

## 2023-03-17 LAB — HEMOGLOBIN A1C
Hgb A1c MFr Bld: 6.4 % — ABNORMAL HIGH (ref 4.8–5.6)
Mean Plasma Glucose: 136.98 mg/dL

## 2023-03-19 ENCOUNTER — Ambulatory Visit: Payer: BC Managed Care – PPO | Admitting: Psychiatry

## 2023-03-19 DIAGNOSIS — F319 Bipolar disorder, unspecified: Secondary | ICD-10-CM | POA: Diagnosis not present

## 2023-03-19 NOTE — Progress Notes (Signed)
[facility] Counselor/Therapist Progress Note  Patient ID: Andrew Glover, MRN: [id number],    Date: 03/19/2023  Time Spent: 55 minutes   Treatment Type: Individual Therapy  Reported Symptoms: anxious, irritability decreasing, mood fluctuations   Mental Status Exam:  Appearance:   Casual     Behavior:  Appropriate, Sharing, and Motivated  Motor:  Normal  Speech/Language:   Clear and Coherent  Affect:  anxious  Mood:  anxious and irritable  Thought process:  goal directed  Thought content:    WNL  Sensory/Perceptual disturbances:    WNL  Orientation:  oriented to person, place, time/date, situation, day of week, month of year, year, and stated date of Oct. 7, 2024  Attention:  Good  Concentration:  Good  Memory:  WNL  Fund of knowledge:   Good  Insight:    Good and Fair  Judgment:   Good  Impulse Control:  Good and Fair   Risk Assessment: Danger to Self:  No Self-injurious Behavior: No Danger to Others: No Duty to Warn:no Physical Aggression / Violence:No  Access to Firearms a concern: No  Gang Involvement:No   Subjective:  Patient in today and reporting anxiety, some communication issues with others particularly the family, and some difficulty coping when circumstances are stressful. Stating today in session he is making progress "because I'm feeling better, more happy at times, more calm at times." Reporting anxiety, frustration, and stress about differences in how he and wife view money management. Processed this more today and venting some of his issues between he and wife that relate more to patient and he acknowledges this. Working on not being as impulsive when angry (giving several examples) and being able to take a break in order to calm down verbally and emotionally, and is making some progress in this. Trying to choose words more carefully. Encouraged patient in some of the effort he is making to change some former ways of communicating in times of stress,  disagreement, and anger. Remaining committed to therapy goals and feels he is progressing. Continues working on becoming more patient, not speaking until he calms down, works to diffuse some difficult situations in which he gets involved, practicing more self calming skills, decrease his overreactions, and working also to improve his listening skills.  Patient showing increased motivation.  Interventions: Cognitive Behavioral Therapy and Ego-Supportive  Reduce overall level, frequency, and intensity of the anxiety so that daily functioning is not impaired. Increase understanding of beliefs and messages that produce the worry and anxiety. Identify and use specific doping strategies for anxiety reduction.   Diagnosis:   ICD-10-CM   1. Bipolar I disorder (HCC)  F31.9      Plan:  Patient in session today showing good motivation and participation as he focused further on his anxiety management, communication within the family, and coping with stressful circumstances.  He continues to do better at trying to diffuse situations.  Patient is making progress and needs to continue his work with goal-directed behaviors to keep moving in a healthier and more hopeful direction. Encouraged patient in his practice of more positive behaviors between sessions including staying in the present and focused on what he can change, emphasize his strengths, stay in contact with supportive people, reduce overthinking and over analyzing, letting go of things from the past that can hold him back now, practice his improved listening skills to really hear what someone else is saying before he responds, practice more positive anger and frustration management skills,  and recognize the strength he shows working with goal-directed behaviors to move in a direction that supports his improved emotional health and overall wellbeing.  Goal review and progress/challenges noted with patient.  Next appointment within 3 weeks.   Mathis Fare, LCSW

## 2023-03-22 DIAGNOSIS — Z6837 Body mass index (BMI) 37.0-37.9, adult: Secondary | ICD-10-CM | POA: Diagnosis not present

## 2023-03-22 DIAGNOSIS — R519 Headache, unspecified: Secondary | ICD-10-CM | POA: Diagnosis not present

## 2023-03-22 DIAGNOSIS — E1169 Type 2 diabetes mellitus with other specified complication: Secondary | ICD-10-CM | POA: Diagnosis not present

## 2023-03-29 ENCOUNTER — Ambulatory Visit: Payer: BC Managed Care – PPO | Admitting: Adult Health

## 2023-03-29 DIAGNOSIS — G4733 Obstructive sleep apnea (adult) (pediatric): Secondary | ICD-10-CM | POA: Diagnosis not present

## 2023-03-29 DIAGNOSIS — E1165 Type 2 diabetes mellitus with hyperglycemia: Secondary | ICD-10-CM | POA: Diagnosis not present

## 2023-04-04 ENCOUNTER — Ambulatory Visit: Payer: BC Managed Care – PPO | Admitting: Dermatology

## 2023-04-04 ENCOUNTER — Encounter: Payer: Self-pay | Admitting: Dermatology

## 2023-04-04 DIAGNOSIS — B356 Tinea cruris: Secondary | ICD-10-CM | POA: Diagnosis not present

## 2023-04-04 DIAGNOSIS — L750 Bromhidrosis: Secondary | ICD-10-CM | POA: Diagnosis not present

## 2023-04-04 MED ORDER — TACROLIMUS 0.1 % EX OINT
TOPICAL_OINTMENT | Freq: Two times a day (BID) | CUTANEOUS | 2 refills | Status: DC
Start: 2023-04-04 — End: 2023-05-01

## 2023-04-04 NOTE — Progress Notes (Signed)
   Follow-Up Visit   Subjective  Andrew Glover is a 62 y.o. male who presents for the following: Rash  Patient present today for follow up visit for TINEA CRURIS. Patient was last evaluated on 01/02/23. Pt has D/C'd tacrolimus cream after using it twice as he experienced a reaction he describes as a "burning" sensation. He has continuously used the antifungal powder prescribed as well as a OTC hydrocortisone cream. Patient reports sxs are unchanged. Patient has medication changes mentioning he was prescribed a "sugar pill" last week by his PCP but can not recall the name.   The following portions of the chart were reviewed this encounter and updated as appropriate: medications, allergies, medical history  Review of Systems:  No other skin or systemic complaints except as noted in HPI or Assessment and Plan.  Objective  Well appearing patient in no apparent distress; mood and affect are within normal limits.   A focused examination was performed of the following areas:   Relevant exam findings are noted in the Assessment and Plan.    Assessment & Plan    TINEA CRURIS / Burning Scrotal Syndrom Exam: Mild erythema, no active lesions   Assessment: Patient reports chronic sensitivity and itching. - Plan:    - Discontinue the use of hydrocortisone to prevent skin atrophy.    - Explained to pt that what started out as a fungal infect has since resolved and has transitioned to chronic irritation.  It could be steroid withdrawal from his initial overuse of steroid creams prior to visiting my office.  We will avoid steroid creams for the immediate future.    - Prescribe tacrolimus for ongoing treatment. Instruct the patient to mix tacrolimus with Desitin or zinc oxide paste for enhanced effectiveness.    -Continue the application of antifungal powder to keep the area dry and prevent yeast and fungus overgrowth.  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 - Plan:    - Recommend daily cleansing with CeraVe Acne Foaming Face Wash containing 4% benzoyl peroxide.    - Instruct the patient to wash the area in the shower, dry thoroughly,     - Advise using Dove soap afterwards to rehydrate the skin.   Tinea cruris  Related Medications Efinaconazole (JUBLIA) 10 % SOLN Apply 1 Application topically daily. TO AFFECTED TOES FOR SIX TO EIGHT MONTHS  tacrolimus (PROTOPIC) 0.1 % ointment Apply topically 2 (two) times daily. Apply to groin twice daily    Return in about 2 months (around 06/04/2023).    Documentation: I have reviewed the above documentation for accuracy and completeness, and I agree with the above.  I, Shirron Marcha Solders, CMA, am acting as scribe for [employer], DO.   Langston Reusing, DO

## 2023-04-04 NOTE — Patient Instructions (Signed)
Hello Andrew Glover,  Thank you for visiting [place] today. We appreciate your commitment to improving your skin health and addressing the concerns you've been experiencing. Here is a summary of the key instructions from today's consultation:  - Specialty Cream: Discontinue using the specialty cream from [facility] due to your adverse reaction.  - Hydrocortisone: STOP using hydrocortisone for as to avoid thinning of the skin  - Tacrolimus Ointment: Transition to tacrolimus ointment to avoid the side effects associated with long-term steroid use. I will resend the prescription for tacrolimus.   - Mixing Instructions: Mix tacrolimus with a little Desitin or zinc oxide paste to help manage the itching and protect the skin.  - CeraVe Acne Foaming Face Wash: Use daily in the shower to address the odor issue.   - Application: Apply it to the affected area, leave it for 30 seconds, then rinse off. Follow with Dove soap to rehydrate the skin.  - Follow-Up: Schedule a follow-up appointment in 2 months to monitor your progress and make any necessary adjustments to your treatment plan.  Please follow these instructions carefully and do not hesitate to contact our office if you have any questions or concerns.  Warm regards,  Dr. Langston Reusing,  Dermatology   Important Information  Due to recent changes in healthcare laws, you may see results of your pathology and/or laboratory studies on MyChart before the doctors have had a chance to review them. We understand that in some cases there may be results that are confusing or concerning to you. Please understand that not all results are received at the same time and often the doctors may need to interpret multiple results in order to provide you with the best plan of care or course of treatment. Therefore, we ask that you please give [place] 2 business days to thoroughly review all your results before contacting the office for clarification. Should we see a critical lab  result, you will be contacted sooner.   If You Need Anything After Your Visit  If you have any questions or concerns for your doctor, please call our main line at [phone] If no one answers, please leave a voicemail as directed and we will return your call as soon as possible. Messages left after 4 pm will be answered the following business day.   You may also send [place] a message via MyChart. We typically respond to MyChart messages within 1-2 business days.  For prescription refills, please ask your pharmacy to contact our office. Our fax number is [phone].  If you have an urgent issue when the clinic is closed that cannot wait until the next business day, you can page your doctor at the number below.    Please note that while we do our best to be available for urgent issues outside of office hours, we are not available 24/7.   If you have an urgent issue and are unable to reach [place], you may choose to seek medical care at your doctor's office, retail clinic, urgent care center, or emergency room.  If you have a medical emergency, please immediately call 911 or go to the emergency department. In the event of inclement weather, please call our main line at [phone] for an update on the status of any delays or closures.  Dermatology Medication Tips: Please keep the boxes that topical medications come in in order to help keep track of the instructions about where and how to use these. Pharmacies typically print the medication instructions only on the boxes  and not directly on the medication tubes.   If your medication is too expensive, please contact our office at [phone] or send [place] a message through MyChart.   We are unable to tell what your co-pay for medications will be in advance as this is different depending on your insurance coverage. However, we may be able to find a substitute medication at lower cost or fill out paperwork to get insurance to cover a needed medication.    If a prior authorization is required to get your medication covered by your insurance company, please allow [place] 1-2 business days to complete this process.  Drug prices often vary depending on where the prescription is filled and some pharmacies may offer cheaper prices.  The website www.goodrx.com contains coupons for medications through different pharmacies. The prices here do not account for what the cost may be with help from insurance (it may be cheaper with your insurance), but the website can give you the price if you did not use any insurance.  - You can print the associated coupon and take it with your prescription to the pharmacy.  - You may also stop by our office during regular business hours and pick up a GoodRx coupon card.  - If you need your prescription sent electronically to a different pharmacy, notify our office through [facility] or by phone at [phone]

## 2023-04-10 ENCOUNTER — Ambulatory Visit (INDEPENDENT_AMBULATORY_CARE_PROVIDER_SITE_OTHER): Payer: BC Managed Care – PPO | Admitting: Psychiatry

## 2023-04-10 DIAGNOSIS — F319 Bipolar disorder, unspecified: Secondary | ICD-10-CM | POA: Diagnosis not present

## 2023-04-10 NOTE — Progress Notes (Signed)
[facility] Counselor/Therapist Progress Note  Patient ID: MCKYLE TORTORA, MRN: [id number],    Date: 04/10/2023  Time Spent: 53 minutes   Treatment Type: Individual Therapy  Reported Symptoms: anxious, some mood fluctuation, irritability    Mental Status Exam:  Appearance:   Casual     Behavior:  Appropriate, Sharing, and Motivated  Motor:  Normal  Speech/Language:   Clear and Coherent  Affect:  Anxious, some irritability  Mood:  anxious and irritable  Thought process:  goal directed  Thought content:    WNL  Sensory/Perceptual disturbances:    WNL  Orientation:  oriented to person, place, time/date, situation, day of week, month of year, year, and stated date of Oct. 29, 2024  Attention:  Good  Concentration:  Good  Memory:  WNL  Fund of knowledge:   Good  Insight:    Good  Judgment:   Good  Impulse Control:  Good and Fair   Risk Assessment: Danger to Self:  No Self-injurious Behavior: No Danger to Others: No Duty to Warn:no Physical Aggression / Violence:No  Access to Firearms a concern: No  Gang Involvement:No   Subjective:  Patient in session today reporting some progress on his own goal and some within the marital relationship. Easily irritated today, and stated he and wife are having increased issues. (Not all details included in this note due to patient privacy needs.) Did talk through multiple issues that are frustrating patient. Was feeling some more positive but the finances are an important issue with patient and feels wife is not good at managing their money and patient has seen evidence of this. States that even thought he is frustrated with wife and the way she managed money etc, he still feels they have made some gains in their relationship and was able to name specific positive changes. Trying to communicate in healthier ways, paying attention to listening as much as talking.  Encouraged patient in his commitment in working further on himself and his  marriage, especially in managing stressful times better and really listening to each other in their communication, Listening as much as talking.        Interventions: Cognitive Behavioral Therapy and Ego-Supportive  Reduce overall level, frequency, and intensity of the anxiety so that daily functioning is not impaired. Increase understanding of beliefs and messages that produce the worry and anxiety. Identify and use specific doping strategies for anxiety reduction.   Diagnosis:   ICD-10-CM   1. Bipolar I disorder (HCC)  F31.9      Plan:  Patient participating well and showing good motivation in session, focusing more on his communication within the family, setting boundaries, anxiety management, and coping with stressful circumstances.  He is continuing to show more positive interactions with others and his ability to diffuse some situations.  He is making progress and showing good motivation.  Patient needs to continue his work with goal-directed behaviors in order to keep moving in a healthier and more hopeful direction.  Encouraged him in practicing more positive behaviors between sessions including: Emphasizing his strengths, stay in contact with supportive people, staying in the present and focused on what he can change, reduce overthinking and over analyzing, letting go of things from the past that can hold him back, practice his improved listening skills to really hear what someone else is saying before he responds, practice being more patient in circumstances, practice more positive anger and frustration management skills, and realize the strength he shows working with  goal-directed behaviors to move in a direction that supports his improved emotional health and his outlook into the future.  Goal review and progress/challenges noted with patient.  Next appointment within 3 weeks.   Mathis Fare, LCSW

## 2023-04-26 ENCOUNTER — Ambulatory Visit: Payer: BC Managed Care – PPO | Admitting: Adult Health

## 2023-04-26 ENCOUNTER — Encounter: Payer: Self-pay | Admitting: Adult Health

## 2023-04-26 DIAGNOSIS — G4700 Insomnia, unspecified: Secondary | ICD-10-CM

## 2023-04-26 DIAGNOSIS — F411 Generalized anxiety disorder: Secondary | ICD-10-CM

## 2023-04-26 DIAGNOSIS — F319 Bipolar disorder, unspecified: Secondary | ICD-10-CM

## 2023-04-26 MED ORDER — LAMOTRIGINE 100 MG PO TABS: 100.0000 mg | ORAL_TABLET | Freq: Two times a day (BID) | ORAL | 1 refills | Status: DC

## 2023-04-26 NOTE — Progress Notes (Addendum)
Andrew Glover [id number] 1960/07/02 62 y.o.  Subjective:   Patient ID:  Andrew Glover is a 61 y.o. (DOB 1961/04/23) male.  Chief Complaint: No chief complaint on file.   HPI Andrew Glover presents to the office today for follow-up of BPD-1, GAD, and insomnia.  Describes mood today as "ok". Pleasant. Denies tearfulness. Mood symptoms - denies depression and anxiety. Reports irritability at times. Denies panic attacks. Reports some over thinking. Denies obsessive thoughts and acts. Mood is variable. Stating "I feel like I'm doing ok". Reports decreased concerns of restless legs - "it's better". Stable interest and motivation. Taking medications as prescribed.  Energy levels good. Active, does not have a regular exercise routine.  Enjoys some usual interests and activities. Married. Lives with wife. Has 3 step children. Spending time with family and friends. Appetite adequate. Weight 205 pounds. Sleeps well most nights. Averages 7 hours. Focus and concentration stable. Completing tasks. Managing aspects of household. Works at [employer] x 44 years. Denies SI or HI.  Denies AH or VH. Denies self harm. Stopped THC and Alcohol x 6 months ago.  Previous medication trials: Abilify, Lamictal, Latuda, Lithium, Rexulti, Trileptal, Geodon   GAD-7    Flowsheet Row Office Visit from 09/20/2021 in [facility] at [facility] Visit from 12/16/2020 in [facility] [employer]  Total GAD-7 Score 1 10      PHQ2-9    Flowsheet Row Office Visit from 09/20/2021 in [facility] Visit from 03[facility]18/2021 in [facility] Visit from 12/16/2020 in [facility] [employer]  PHQ-2 Total Score 0 0 0  PHQ-9 Total Score 0 -- 10      Flowsheet Row ED from 03/16/2023 in [facility] at [facility]  C-SSRS RISK CATEGORY No  Risk        Review of Systems:  Review of Systems  Musculoskeletal:  Negative for gait problem.  Neurological:  Negative for tremors.  Psychiatric/Behavioral:         Please refer to HPI    Medications: I have reviewed the patient's current medications.  Current Outpatient Medications  Medication Sig Dispense Refill   ascorbic acid (VITAMIN C) 1000 MG tablet Take 1,000 mg by mouth daily.     aspirin 81 MG EC tablet Take 1 tablet by mouth every morning.     atenolol-chlorthalidone (TENORETIC) 50-25 MG tablet Take 1 tablet by mouth daily. 30 tablet 1   atorvastatin (LIPITOR) 40 MG tablet Take 1 tablet (40 mg total) by mouth daily. Take 40 mg by mouth daily. 30 tablet 1   cetirizine (ZYRTEC) 10 MG tablet Take 10 mg by mouth daily.     clobetasol cream (TEMOVATE) 0.05 % Apply 1 Application topically as needed.     Efinaconazole (JUBLIA) 10 % SOLN Apply 1 Application topically daily. TO AFFECTED TOES FOR SIX TO EIGHT MONTHS 8 mL 6   ergocalciferol (VITAMIN D2) 1.25 MG (50000 UT) capsule Take by mouth.     lamoTRIgine (LAMICTAL) 100 MG tablet Take 1 tablet (100 mg total) by mouth 2 (two) times daily. 180 tablet 1   losartan (COZAAR) 100 MG tablet Take 1 tablet (100 mg total) by mouth daily. 30 tablet 1   Multiple Vitamin (MULTIVITAMIN PO) Take 1 tablet by mouth daily.     tacrolimus (PROTOPIC) 0.1 % ointment Apply topically 2 (two) times daily. Apply to groin twice  daily 100 g 2   triamcinolone cream (KENALOG) 0.1 % Apply 1 application topically 2 (two) times daily. (Patient taking differently: Apply 1 application  topically as needed.) 30 g 0   No current facility-administered medications for this visit.    Medication Side Effects: None  Allergies:  Allergies  Allergen Reactions   Aripiprazole     Other Reaction(s): akathisias, weight gain on 5 mg daily (43 lbs over 6 yrs.Only 6 lbs and no akathisias over 2 years.   Brexpiprazole     Other Reaction(s): itchy ankles, not effective  on 1.5 mg qd   Lamotrigine     Other Reaction(s): no SE, not effective   Lithium     Other Reaction(s): no SE, not effective   Lurasidone     Other Reaction(s): increased appetite, not as "laid back:   Quetiapine     Other Reaction(s): sleepy, increased weight, not effective   Sulfa Antibiotics Hives and Itching   Ziprasidone     Other Reaction(s): drowsiness on 40 mg twice daily   Codeine Itching and Rash   Sulfamethoxazole Rash   Sulfamethoxazole-Trimethoprim Rash    Past Medical History:  Diagnosis Date   Bipolar 1 disorder (HCC)    Hyperlipidemia    Hypertension     Past Medical History, Surgical history, Social history, and Family history were reviewed and updated as appropriate.   Please see review of systems for further details on the patient's review from today.   Objective:   Physical Exam:  There were no vitals taken for this visit.  Physical Exam Constitutional:      General: He is not in acute distress. Musculoskeletal:        General: No deformity.  Neurological:     Mental Status: He is alert and oriented to person, place, and time.     Coordination: Coordination normal.  Psychiatric:        Attention and Perception: Attention and perception normal. He does not perceive auditory or visual hallucinations.        Mood and Affect: Mood is not anxious. Affect is not labile, blunt, angry or inappropriate.        Speech: Speech normal.        Behavior: Behavior normal.        Thought Content: Thought content normal. Thought content is not paranoid or delusional. Thought content does not include homicidal or suicidal ideation. Thought content does not include homicidal or suicidal plan.        Cognition and Memory: Cognition and memory normal.        Judgment: Judgment normal.     Comments: Insight intact     Lab Review:     Component Value Date/Time   NA 140 03/16/2023 2131   K 3.7 03/16/2023 2131   CL 104 03/16/2023 2131   CO2 29 03/16/2023 2131    GLUCOSE 144 (H) 03/16/2023 2131   BUN 19 03/16/2023 2131   CREATININE 0.94 03/16/2023 2131   CALCIUM 9.2 03/16/2023 2131   PROT 6.5 03/16/2023 2131   ALBUMIN 4.2 03/16/2023 2131   AST 18 03/16/2023 2131   ALT 21 03/16/2023 2131   ALKPHOS 78 03/16/2023 2131   BILITOT 0.5 03/16/2023 2131   GFRNONAA >60 03/16/2023 2131   GFRAA >90 03/20/2011 1955       Component Value Date/Time   WBC 8.3 03/16/2023 2131   RBC 5.07 03/16/2023 2131   HGB 15.8 03/16/2023 2131   HCT 44.2 03/16/2023 2131  PLT 187 03/16/2023 2131   MCV 87.2 03/16/2023 2131   MCH 31.2 03/16/2023 2131   MCHC 35.7 03/16/2023 2131   RDW 13.4 03/16/2023 2131   LYMPHSABS 2.2 03/16/2023 2131   MONOABS 0.6 03/16/2023 2131   EOSABS 0.2 03/16/2023 2131   BASOSABS 0.0 03/16/2023 2131    No results found for: "POCLITH", "LITHIUM"   No results found for: "PHENYTOIN", "PHENOBARB", "VALPROATE", "CBMZ"   .res Assessment: Plan:    Plan:  PDMP reviewed  Working with therapist - Rockne Menghini  Lamictal 100mg  at hs and 50mg  in the morning.   RTC 4 weeks  Patient advised to contact office with any questions, adverse effects, or acute worsening in signs and symptoms.  Counseled patient regarding potential benefits, risks, and side effects of Lamictal to include potential risk of Stevens-Johnson syndrome. Advised patient to stop taking Lamictal and contact office immediately if rash develops and to seek urgent medical attention if rash is severe and/or spreading quickly.   Diagnoses and all orders for this visit:  Bipolar I disorder (HCC) -     lamoTRIgine (LAMICTAL) 100 MG tablet; Take 1 tablet (100 mg total) by mouth 2 (two) times daily.  Generalized anxiety disorder  Insomnia, unspecified type     Please see After Visit Summary for patient specific instructions.  Future Appointments  Date Time Provider Department Center  05/01/2023  4:15 PM Terri Piedra, DO [facility] None  05/03/2023  3:00 PM Mathis Fare,  LCSW [facility] None  05/28/2023 10:00 AM Mathis Fare, LCSW [facility] None  05/30/2023  8:45 AM Terri Piedra, DO [facility] None  06/18/2023  5:00 PM Mathis Fare, LCSW [facility] None  07/02/2023  5:00 PM Mathis Fare, LCSW [facility] None  07/19/2023  5:00 PM Mathis Fare, LCSW [facility] None  08/09/2023  5:00 PM Mathis Fare, LCSW [facility] None    No orders of the defined types were placed in this encounter.   -------------------------------

## 2023-04-29 DIAGNOSIS — G4733 Obstructive sleep apnea (adult) (pediatric): Secondary | ICD-10-CM | POA: Diagnosis not present

## 2023-04-30 DIAGNOSIS — E1169 Type 2 diabetes mellitus with other specified complication: Secondary | ICD-10-CM | POA: Diagnosis not present

## 2023-04-30 DIAGNOSIS — E782 Mixed hyperlipidemia: Secondary | ICD-10-CM | POA: Diagnosis not present

## 2023-04-30 DIAGNOSIS — I10 Essential (primary) hypertension: Secondary | ICD-10-CM | POA: Diagnosis not present

## 2023-04-30 DIAGNOSIS — F3181 Bipolar II disorder: Secondary | ICD-10-CM | POA: Diagnosis not present

## 2023-05-01 ENCOUNTER — Encounter: Payer: Self-pay | Admitting: Dermatology

## 2023-05-01 ENCOUNTER — Ambulatory Visit: Payer: BC Managed Care – PPO | Admitting: Psychiatry

## 2023-05-01 ENCOUNTER — Ambulatory Visit: Payer: BC Managed Care – PPO | Admitting: Dermatology

## 2023-05-01 VITALS — BP 125/79 | HR 65

## 2023-05-01 DIAGNOSIS — L304 Erythema intertrigo: Secondary | ICD-10-CM

## 2023-05-01 DIAGNOSIS — L308 Other specified dermatitis: Secondary | ICD-10-CM | POA: Diagnosis not present

## 2023-05-01 DIAGNOSIS — L409 Psoriasis, unspecified: Secondary | ICD-10-CM

## 2023-05-01 DIAGNOSIS — D485 Neoplasm of uncertain behavior of skin: Secondary | ICD-10-CM

## 2023-05-01 DIAGNOSIS — D492 Neoplasm of unspecified behavior of bone, soft tissue, and skin: Secondary | ICD-10-CM | POA: Diagnosis not present

## 2023-05-01 DIAGNOSIS — R21 Rash and other nonspecific skin eruption: Secondary | ICD-10-CM

## 2023-05-01 MED ORDER — OTEZLA 30 MG PO TABS
30.0000 mg | ORAL_TABLET | Freq: Two times a day (BID) | ORAL | 11 refills | Status: DC
Start: 2023-05-01 — End: 2023-05-15

## 2023-05-01 MED ORDER — ZORYVE 0.3 % EX CREA: 1.0000 | TOPICAL_CREAM | Freq: Every day | CUTANEOUS | 3 refills | Status: DC

## 2023-05-01 NOTE — Progress Notes (Unsigned)
Follow-Up Visit   Subjective  Andrew Glover is a 62 y.o. male who presents for the following: Tinea Cruris & Bromhidrosis  Patient present today for follow up visit for Tinea Cruris & Bromhidrosis. Patient was last evaluated on 04/04/23. At his previous visit he was prescribed Jublia to apply to effected toenails and Tacrolimus to apply to his groin. Patient reports sxs are unchanged. Patient denies medication changes.  The following portions of the chart were reviewed this encounter and updated as appropriate: medications, allergies, medical history  Review of Systems:  No other skin or systemic complaints except as noted in HPI or Assessment and Plan.  Objective  Well appearing patient in no apparent distress; mood and affect are within normal limits.  A focused examination was performed of the following areas: Toenails and groin  Relevant exam findings are noted in the Assessment and Plan. Right Genitocrural Fold Psoriatic erythematous plaques with silvery scale             Assessment & Plan   PSORIASIS Exam: Psoriatic erythematous plaques with silvery scale located on Right Toes, ***% BSA.  Flared  Patient reports joint pain  Psoriasis is a chronic non-curable, but treatable genetic/hereditary disease that may have other systemic features affecting other organ systems such as joints (Psoriatic Arthritis). It is associated with an increased risk of inflammatory bowel disease, heart disease, non-alcoholic fatty liver disease, and depression.  Treatments include light and laser treatments; topical medications; and systemic medications including oral and injectables.  Treatment Plan: - We will prescribe Zoryve Cream, samples provided while in office today, instructed it is safe to use in groin - Recommended applying Clobetasol creams to toes and lower extremities 2 times a day for 2 weeks then switch to daily use of Zoryve - We will prescribe [place], Patient provided with  initial starter pack while in office  Pt is not a candidate for methotrexate or cyclosporine due to inability for follow up labs and contraindications with other medications. Pt has no access to a light box for phototherapy.  Due to the progressive and chronic nature of his psoriasis, patient has tried and failed numerous topicals creams as noted above, the next best therapeutic option is a systemic therapy. It is medically necessary to help improve her quality of life.    Psoriasis  Related Medications Roflumilast (ZORYVE) 0.3 % CREA Apply 1 Application topically daily.  Apremilast (OTEZLA) 30 MG TABS Take 1 tablet (30 mg total) by mouth 2 (two) times daily.  Neoplasm of uncertain behavior of skin Right Genitocrural Fold  Skin / nail biopsy Type of biopsy: punch   Informed consent: discussed and consent obtained   Timeout: patient name, date of birth, surgical site, and procedure verified   Procedure prep:  Patient was prepped and draped in usual sterile fashion Prep type:  Isopropyl alcohol Anesthesia: the lesion was anesthetized in a standard fashion   Anesthetic:  1% lidocaine w/ epinephrine 1-100,000 buffered w/ 8.4% NaHCO3 Punch size:  3 mm Suture size:  4-0 Suture type: nylon   Suture removal (days):  14 Hemostasis achieved with: suture and aluminum chloride   Outcome: patient tolerated procedure well   Post-procedure details: sterile dressing applied and wound care instructions given   Dressing type: petrolatum gauze    Specimen 1 - Surgical pathology Differential Diagnosis: Inverse psoriasis vs intertirgo  Check Margins: No   Return in about 2 weeks (around 05/15/2023) for Psoriasis F/U.    Documentation: I have reviewed the  above documentation for accuracy and completeness, and I agree with the above.  Stasia Cavalier, am acting as scribe for Langston Reusing, DO.   Langston Reusing, DO

## 2023-05-01 NOTE — Patient Instructions (Addendum)
Hello Andrew Glover,  Thank you for visiting my office today. Your dedication to managing your dermatological conditions and improving your health is greatly appreciated. Below is a summary of the key instructions from today's consultation:  - Medications Prescribed:   - Clobetasol: Apply to psoriasis areas on the foot for 2 weeks.   Milda Smart Cream: Begin application for suspected psoriasis in the groin 1-2 times a day.   Henderson [place]: Start with a 10 mg tablet, increasing to 30 mg twice daily. Utilize the starter packs provided until insurance approval is obtained.  - Lifestyle Recommendations:   - Consider incorporating oatmeal baths and Epsom salt into your routine to alleviate itching and discomfort.   - Continue the use of Zeasorb AF antifungal powder as previously discussed.  - Follow-Up Care:   - A biopsy was performed today to confirm the diagnosis of psoriasis; a follow-up appointment is scheduled in two weeks to remove the stitch and discuss the pathology report.   - Please continue to monitor your condition and report any worsening or new symptoms.  - Educational Materials:   - Samples of Zoryve and instructions for its application have been provided.  Please adhere to the provided instructions carefully and reach out to my office should you have any questions or concerns. We are looking forward to your follow-up visit in two weeks.  Best regards,  Dr. Lang[facility] Reusing [facility]     Patient Handout: Wound Care for Skin Biopsy Site  Taking Care of Your Skin Biopsy Site  Proper care of the biopsy site is essential for promoting healing and minimizing scarring. This handout provides instructions on how to care for your biopsy site to ensure optimal recovery.  1. Cleaning the Wound:  Clean the biopsy site daily with gentle soap and water. Gently pat the area dry with a clean, soft towel. Avoid harsh scrubbing or rubbing the area, as this can irritate the skin and delay  healing.  2. Applying Aquaphor and Bandage:  After cleaning the wound, apply a thin layer of Aquaphor ointment to the biopsy site. Cover the area with a sterile bandage to protect it from dirt, bacteria, and friction. Change the bandage daily or as needed if it becomes soiled or wet.  3. Continued Care for One Week:  Repeat the cleaning, Aquaphor application, and bandaging process daily for one week following the biopsy procedure. Keeping the wound clean and moist during this initial healing period will help prevent infection and promote optimal healing.  4. Massaging Aquaphor into the Area:  ---After one week, discontinue the use of bandages but continue to apply Aquaphor to the biopsy site. ----Gently massage the Aquaphor into the area using circular motions. ---Massaging the skin helps to promote circulation and prevent the formation of scar tissue.   Additional Tips:  Avoid exposing the biopsy site to direct sunlight during the healing process, as this can cause hyperpigmentation or worsen scarring. If you experience any signs of infection, such as increased redness, swelling, warmth, or drainage from the wound, contact your healthcare provider immediately. Follow any additional instructions provided by your healthcare provider for caring for the biopsy site and managing any discomfort. Conclusion:  Taking proper care of your skin biopsy site is crucial for ensuring optimal healing and minimizing scarring. By following these instructions for cleaning, applying Aquaphor, and massaging the area, you can promote a smooth and successful recovery. If you have any questions or concerns about caring for your biopsy site, don't hesitate to contact  your healthcare provider for guidance.    Important Information   Due to recent changes in healthcare laws, you may see results of your pathology and/or laboratory studies on MyChart before the doctors have had a chance to review them. We  understand that in some cases there may be results that are confusing or concerning to you. Please understand that not all results are received at the same time and often the doctors may need to interpret multiple results in order to provide you with the best plan of care or course of treatment. Therefore, we ask that you please give [place] 2 business days to thoroughly review all your results before contacting the office for clarification. Should we see a critical lab result, you will be contacted sooner.     If You Need Anything After Your Visit   If you have any questions or concerns for your doctor, please call our main line at [phone]. If no one answers, please leave a voicemail as directed and we will return your call as soon as possible. Messages left after 4 pm will be answered the following business day.    You may also send [place] a message via MyChart. We typically respond to MyChart messages within 1-2 business days.  For prescription refills, please ask your pharmacy to contact our office. Our fax number is [phone].  If you have an urgent issue when the clinic is closed that cannot wait until the next business day, you can page your doctor at the number below.     Please note that while we do our best to be available for urgent issues outside of office hours, we are not available 24/7.    If you have an urgent issue and are unable to reach [place], you may choose to seek medical care at your doctor's office, retail clinic, urgent care center, or emergency room.   If you have a medical emergency, please immediately call 911 or go to the emergency department. In the event of inclement weather, please call our main line at [phone] for an update on the status of any delays or closures.  Dermatology Medication Tips: Please keep the boxes that topical medications come in in order to help keep track of the instructions about where and how to use these. Pharmacies typically print the  medication instructions only on the boxes and not directly on the medication tubes.   If your medication is too expensive, please contact our office at [phone] or send [place] a message through MyChart.    We are unable to tell what your co-pay for medications will be in advance as this is different depending on your insurance coverage. However, we may be able to find a substitute medication at lower cost or fill out paperwork to get insurance to cover a needed medication.    If a prior authorization is required to get your medication covered by your insurance company, please allow [place] 1-2 business days to complete this process.   Drug prices often vary depending on where the prescription is filled and some pharmacies may offer cheaper prices.   The website www.goodrx.com contains coupons for medications through different pharmacies. The prices here do not account for what the cost may be with help from insurance (it may be cheaper with your insurance), but the website can give you the price if you did not use any insurance.  - You can print the associated coupon and take it with your prescription to the pharmacy.  -  You may also stop by our office during regular business hours and pick up a GoodRx coupon card.  - If you need your prescription sent electronically to a different pharmacy, notify our office through [facility] or by phone at [phone]

## 2023-05-02 LAB — SURGICAL PATHOLOGY

## 2023-05-02 NOTE — Progress Notes (Signed)
Follow-Up Visit   Subjective  Andrew Glover is a 62 y.o. male who presents for the following: Tinea Cruris & Bromhidrosis  Patient present today for follow up visit for Tinea Cruris & Bromhidrosis. Patient was last evaluated on 04/04/23. At his previous visit he was prescribed Jublia to apply to effected toenails and Tacrolimus to apply to his groin. Patient reports sxs are unchanged. Patient denies medication changes.  The following portions of the chart were reviewed this encounter and updated as appropriate: medications, allergies, medical history  Review of Systems:  No other skin or systemic complaints except as noted in HPI or Assessment and Plan.  Objective  Well appearing patient in no apparent distress; mood and affect are within normal limits.  A focused examination was performed of the following areas: Toenails and groin  Relevant exam findings are noted in the Assessment and Plan. Erythematous plaques in b/l inguinal folds  Right Genitocrural Fold Psoriatic erythematous plaques with silvery scale             Assessment & Plan   PSORIASIS Exam: Psoriatic erythematous plaques with silvery scale located on Right Toes,   Flared  Patient reports joint pain  Psoriasis is a chronic non-curable, but treatable genetic/hereditary disease that may have other systemic features affecting other organ systems such as joints (Psoriatic Arthritis). It is associated with an increased risk of inflammatory bowel disease, heart disease, non-alcoholic fatty liver disease, and depression.  Treatments include light and laser treatments; topical medications; and systemic medications including oral and injectables.  Treatment Plan: - We will prescribe Zoryve Cream, samples provided while in office today, instructed it is safe to use in groin - Recommended applying Clobetasol creams to toes and lower extremities 2 times a day for 2 weeks then switch to daily use of Zoryve - We will  prescribe Henderson [place], Patient provided with initial starter pack while in office  Pt is not a candidate for methotrexate or cyclosporine due to inability for follow up labs and contraindications with other medications. Pt has no access to a light box for phototherapy.  Due to the progressive and chronic nature of his psoriasis, patient has tried and failed numerous topicals creams as noted above, the next best therapeutic option is a systemic therapy. It is medically necessary to help improve her quality of life.    Psoriasis  Related Medications Roflumilast (ZORYVE) 0.3 % CREA Apply 1 Application topically daily.  Apremilast (OTEZLA) 30 MG TABS Take 1 tablet (30 mg total) by mouth 2 (two) times daily.  Intertrigo  Rash and other nonspecific skin eruption Right Genitocrural Fold  Skin / nail biopsy - Right Genitocrural Fold Type of biopsy: punch   Informed consent: discussed and consent obtained   Timeout: patient name, date of birth, surgical site, and procedure verified   Procedure prep:  Patient was prepped and draped in usual sterile fashion Prep type:  Isopropyl alcohol Anesthesia: the lesion was anesthetized in a standard fashion   Anesthetic:  1% lidocaine w/ epinephrine 1-100,000 buffered w/ 8.4% NaHCO3 Punch size:  3 mm Suture size:  4-0 Suture type: nylon   Suture removal (days):  14 Hemostasis achieved with: suture and aluminum chloride   Outcome: patient tolerated procedure well   Post-procedure details: sterile dressing applied and wound care instructions given   Dressing type: petrolatum gauze    Specimen 1 - Surgical pathology Differential Diagnosis: Inverse psoriasis vs intertirgo  Check Margins: No  Neoplasm of uncertain behavior of skin  Return in about 2 weeks (around 05/15/2023) for Psoriasis F/U.    Documentation: I have reviewed the above documentation for accuracy and completeness, and I agree with the above.  Stasia Cavalier, am acting as scribe  for Langston Reusing, DO.   Langston Reusing, DO

## 2023-05-03 ENCOUNTER — Ambulatory Visit: Payer: BC Managed Care – PPO | Admitting: Psychiatry

## 2023-05-03 DIAGNOSIS — F319 Bipolar disorder, unspecified: Secondary | ICD-10-CM | POA: Diagnosis not present

## 2023-05-03 NOTE — Progress Notes (Signed)
      [facility] Counselor/Therapist Progress Note  Patient ID: Andrew Glover, MRN: [id number],    Date: 05/03/2023  Time Spent: 53 minutes  Treatment Type: Individual Therapy  Reported Symptoms: anxious, irritability   Mental Status Exam:  Appearance:   Casual     Behavior:  Appropriate, Sharing, and Motivated  Motor:  Normal  Speech/Language:   Normal Rate  Affect:  anxious  Mood:  anxious and irritable  Thought process:  goal directed  Thought content:    WNL  Sensory/Perceptual disturbances:    WNL  Orientation:  oriented to person, place, time/date, situation, day of week, month of year, year, and stated date of Nov. 21, 2024  Attention:  Good  Concentration:  Good and Fair  Memory:  WNL  Fund of knowledge:   Good  Insight:    Fair  Judgment:   Good and Fair  Impulse Control:  Good and Fair   Risk Assessment: Danger to Self:  No Self-injurious Behavior: No Danger to Others: No Duty to Warn:no Physical Aggression / Violence:No  Access to Firearms a concern: No  Gang Involvement:No   Subjective: Patient in session today reporting some progress on his own goals however some recent stress regarding he and wife's marital relationship and they are trying to work through this. Also working on his irritability and anxiety, and does report some decrease in irritability which we focused on more today.  Urged to continue working on communicating in healthier ways particularly with his spouse and to focus on listening as much is talking, and patient is in agreement with this.  Working to better manage stressful times within the marriage together and really listen to each other as well as remaining nonjudgmental.  Interventions: Cognitive Behavioral Therapy and Ego-Supportive  Reduce overall level, frequency, and intensity of the anxiety so that daily functioning is not impaired. Increase understanding of beliefs and messages that produce the worry and anxiety. Identify and  use specific doping strategies for anxiety reduction.   Diagnosis:   ICD-10-CM   1. Bipolar I disorder (HCC)  F31.9      Plan:  Patient showing good motivation and participation as he worked further today on marital communication, managing his anxiety more effectively, improving boundaries, and trying to cope better with stressful and frustrating circumstances. Is making some progress and needs to continue working with goal-directed behaviors to move forward in a more healthy and hopeful direction.  Encouraged patient to be practicing more positive behaviors between sessions including: Emphasizing his strengths, stay in contact with supportive people, remain in the present and focused on what he can change, reduce overthinking and over analyzing, letting go of things from the past that can hold him back, practice his improved listening skills to really hear what someone else is saying, being more patient in situations, and recognize the strength he shows working with goal-directed behaviors to move in a direction that supports his improved emotional health and his outlook into the future.  Goal review and progress/challenges noted with patient.  Next appointment within 3 weeks.   Mathis Fare, LCSW

## 2023-05-13 ENCOUNTER — Telehealth: Payer: Self-pay | Admitting: Neurology

## 2023-05-13 NOTE — Telephone Encounter (Signed)
Patient called, was seen by Dr. Sherol Dade in March 2024, calling with different symptoms pain in both legs cannot sleep not the same as was seen in March 2024.  Unfortunately if these are new symptoms they should call their primary care to be evaluated and since it is Friday, November 29 in the morning they may be able to get in or proceed to the ED unfortunately I cannot provide any medical management or treatment until they are seen for new symptoms.  And they should see their primary care first and be referred back to neurology if this appears to be neurologic to their primary care, there can be many causes of pain in both legs and not all of them are neurologic in nature.  I hope that they felt better and they are very kind.

## 2023-05-14 ENCOUNTER — Other Ambulatory Visit: Payer: Self-pay | Admitting: Neurology

## 2023-05-14 MED ORDER — METHYLPREDNISOLONE 4 MG PO TABS: ORAL_TABLET | ORAL | 0 refills | Status: AC

## 2023-05-15 ENCOUNTER — Encounter: Payer: Self-pay | Admitting: Dermatology

## 2023-05-15 ENCOUNTER — Ambulatory Visit: Payer: BC Managed Care – PPO | Admitting: Dermatology

## 2023-05-15 DIAGNOSIS — L750 Bromhidrosis: Secondary | ICD-10-CM | POA: Diagnosis not present

## 2023-05-15 DIAGNOSIS — L409 Psoriasis, unspecified: Secondary | ICD-10-CM | POA: Diagnosis not present

## 2023-05-15 DIAGNOSIS — B351 Tinea unguium: Secondary | ICD-10-CM

## 2023-05-15 NOTE — Telephone Encounter (Signed)
Called and spoke with wife. Relayed Dr. Bonnita Hollow recommendation for her husband. She verbalized understanding and will let her husband know. He was at work at time of call.

## 2023-05-15 NOTE — Progress Notes (Signed)
   Follow-Up Visit   Subjective  Andrew Glover is a 62 y.o. male who presents for the following: psoriasis- suture removal - biopsy follow up  Patient present today for follow up visit for suture removal & biopsy results. Patient was last evaluated on 05/01/23. Rx Zorvye 0.3% foam - use daily, Otezla - 1 tab po BID. 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 Patient reports sxs are better mentioning that he no longer has any itching. Patient denies medication changes. Suture has came out itself as it was not in the biopsy site upon exam.  The following portions of the chart were reviewed this encounter and updated as appropriate: medications, allergies, medical history  Review of Systems:  No other skin or systemic complaints except as noted in HPI or Assessment and Plan.  Objective  Well appearing patient in no apparent distress; mood and affect are within normal limits.   A focused examination was performed of the following areas:   Relevant exam findings are noted in the Assessment and Plan.    Assessment & Plan   1. Psoriasis - Assessment: Patient presents with scaly plaques on legs consistent with psoriasis. Lesions appear less irritated but more pigmented, which is typical for psoriasis. Topical treatment with Milda Smart is ongoing. - Plan: Continue daily application of Zoryve cream on affected areas. Discontinue Otezla due to side effects (diarrhea and stomach issues). Follow up in 3 months.  2. Groin Irritation - Assessment: Previous groin irritation was determined to be either eczema or contact dermatitis, not psoriasis. Biopsy did not confirm psoriasis. Current treatment is providing relief. - Plan: Continue current treatment regimen. Monitor for improvement.  3. Toenail Fungus - Assessment: Toenails are improving. Jublia caused irritation. Psoriasis treatment on toes may take up to 8 weeks for irritation to clear. - Plan:  Discontinue Jublia. Apply Zoryve to affected toenails. Allow up to 8 weeks for irritation to clear.  4. Body Odor - Assessment: Patient reports sweaty smell. Previous recommendation of benzoyl peroxide wash caused burning. - Plan: Use Dove antibacterial soap as a gentler alternative. Patient to check for availability at home; picture to be included in Activist Summary.  No follow-ups on file.    Documentation: I have reviewed the above documentation for accuracy and completeness, and I agree with the above.  I, Shirron Marcha Solders, CMA, am acting as scribe for [employer], DO.   Langston Reusing, DO

## 2023-05-15 NOTE — Patient Instructions (Addendum)
Hello Andrew Glover,  Thank you for visiting my office today. Your dedication to managing your skin condition and improving your health is greatly appreciated. Here is a summary of the key instructions from our appointment today:  - Zoryve Cream: Continue applying Zoryve cream once daily to the affected areas, including your legs.   - Application: Ensure to cover all affected areas for optimal results.   - Duration: Allow up to 8 weeks to observe improvement.  - Jublia: Discontinue using Jublia for toenail issues. Switch to using Zoryve in its place.  - Cleanser Change: Replace benzoyl peroxide wash with Dove antibacterial soap to minimize irritation.   - Note: Check at home for [facility] soap; a picture is included in your Activist Summary for reference.  - Medication Adjustment: Stop taking Otezla due to side effects such as diarrhea and stomach issues. Continue with topical treatments only.  - Follow-Up: A follow-up appointment is scheduled in 3 months to monitor your progress. We hope to see significant improvement by your next visit.  Wishing you a happy holiday season and a healthy new year!  And HAPPY EARLY BIRTHDAY! If you have any questions or concerns before your next visit, please do not hesitate to contact our office.  Warm regards,  Dr. Langston Reusing, Dermatology         Important Information  Due to recent changes in healthcare laws, you may see results of your pathology and/or laboratory studies on MyChart before the doctors have had a chance to review them. We understand that in some cases there may be results that are confusing or concerning to you. Please understand that not all results are received at the same time and often the doctors may need to interpret multiple results in order to provide you with the best plan of care or course of treatment. Therefore, we ask that you please give [place] 2 business days to thoroughly review all your results before contacting the office for  clarification. Should we see a critical lab result, you will be contacted sooner.   If You Need Anything After Your Visit  If you have any questions or concerns for your doctor, please call our main line at [phone] If no one answers, please leave a voicemail as directed and we will return your call as soon as possible. Messages left after 4 pm will be answered the following business day.   You may also send [place] a message via MyChart. We typically respond to MyChart messages within 1-2 business days.  For prescription refills, please ask your pharmacy to contact our office. Our fax number is [phone].  If you have an urgent issue when the clinic is closed that cannot wait until the next business day, you can page your doctor at the number below.    Please note that while we do our best to be available for urgent issues outside of office hours, we are not available 24/7.   If you have an urgent issue and are unable to reach [place], you may choose to seek medical care at your doctor's office, retail clinic, urgent care center, or emergency room.  If you have a medical emergency, please immediately call 911 or go to the emergency department. In the event of inclement weather, please call our main line at [phone] for an update on the status of any delays or closures.  Dermatology Medication Tips: Please keep the boxes that topical medications come in in order to help keep track of the instructions about where  and how to use these. Pharmacies typically print the medication instructions only on the boxes and not directly on the medication tubes.   If your medication is too expensive, please contact our office at [phone] or send [place] a message through MyChart.   We are unable to tell what your co-pay for medications will be in advance as this is different depending on your insurance coverage. However, we may be able to find a substitute medication at lower cost or fill out paperwork to  get insurance to cover a needed medication.   If a prior authorization is required to get your medication covered by your insurance company, please allow [place] 1-2 business days to complete this process.  Drug prices often vary depending on where the prescription is filled and some pharmacies may offer cheaper prices.  The website www.goodrx.com contains coupons for medications through different pharmacies. The prices here do not account for what the cost may be with help from insurance (it may be cheaper with your insurance), but the website can give you the price if you did not use any insurance.  - You can print the associated coupon and take it with your prescription to the pharmacy.  - You may also stop by our office during regular business hours and pick up a GoodRx coupon card.  - If you need your prescription sent electronically to a different pharmacy, notify our office through [facility] or by phone at [phone]

## 2023-05-28 ENCOUNTER — Ambulatory Visit: Payer: BC Managed Care – PPO | Admitting: Psychiatry

## 2023-05-28 DIAGNOSIS — F319 Bipolar disorder, unspecified: Secondary | ICD-10-CM

## 2023-05-28 NOTE — Progress Notes (Signed)
[facility] Counselor/Therapist Progress Note  Patient ID: Andrew Glover, MRN: [id number],    Date: 05/28/2023  Time Spent: 53 minutes  Treatment Type: Individual Therapy  Reported Symptoms:  anxiety, irritability    Mental Status Exam:  Appearance:   Casual     Behavior:  Appropriate, Sharing, and Motivated  Motor:  Normal  Speech/Language:   Clear and Coherent  Affect:  Anxiety, irritability  Mood:  anxious and irritable  Thought process:  goal directed  Thought content:    WNL  Sensory/Perceptual disturbances:    WNL  Orientation:  oriented to person, place, time/date, situation, day of week, month of year, year, and stated date of Dec. 16, 2024  Attention:  Good  Concentration:  Good  Memory:  WNL  Fund of knowledge:   Good  Insight:    Good and Fair  Judgment:   Good and Fair  Impulse Control:  Fair   Risk Assessment: Danger to Self:  No Self-injurious Behavior: No Danger to Others: No Duty to Warn:no Physical Aggression / Violence:No  Access to Firearms a concern: No  Gang Involvement:No   Subjective: Patient in for session today and reports he has progressed some on his goals including reducing the intensity of his anger and paying attention to "how he says what he says" especially in his marriage and work relationships. He feels he is trying to work on his marriage but "my wife is not". Tendency to blame each other and opted out of marital therapy for now. Focuses further on his irritability, anxiety, communication. Listening in relationships is a challenge and we worked further on this in session also today. Focused more on his verbal and non-verbal communication, and the need to place more focus on listening skills before responding. Discussed his work on better managing stress within his marriage , and reports "focusing and getting finances split up and more manageable. Denies any physical abuse in marital relationship. Working to be more nonjudgmental in  certain situations. Family and extended family on wife's side, continue to have difficulty in relationships. Suggested some different ways on communicating within th family as this continues to be an area of tension and struggle.   Interventions: Cognitive Behavioral Therapy and Ego-Supportive Reduce overall level, frequency, and intensity of the anxiety so that daily functioning is not impaired. Increase understanding of beliefs and messages that produce the worry and anxiety. Identify and use specific doping strategies for anxiety reduction.   Diagnosis:   ICD-10-CM   1. Bipolar I disorder (HCC)  F31.9      Plan:  Patient actively participating in session and showing good motivation as he focused more on his struggles and marital communication, improving boundaries with others, trying to manage anxiety more effectively, and trying to cope better with stressful and frustrating circumstances especially within the family.  He has made some progress and needs to continue working with goal-directed behaviors to move forward in a healthier and more hopeful direction.  Encouraged patient in his practice of more positive behaviors between sessions including: Emphasizing his strengths, paying attention to his anger and managing it more productively, staying in contact with supportive people remain in the present and focused on what he can change, reduce overthinking and over analyzing, letting go of things from the past that can hold him back now, practice his improve listening skills to really hear what someone else is saying before responding, being more patient in situations, and realize the strength he shows working  with goal-directed behaviors to move in a direction that supports his improved emotional health and overall wellbeing.  Goal review and progress/challenges noted with patient.  Next appointment within 3 weeks.   Mathis Fare, LCSW

## 2023-05-29 DIAGNOSIS — G4733 Obstructive sleep apnea (adult) (pediatric): Secondary | ICD-10-CM | POA: Diagnosis not present

## 2023-05-30 ENCOUNTER — Encounter: Payer: Self-pay | Admitting: Dermatology

## 2023-05-30 ENCOUNTER — Ambulatory Visit: Payer: BC Managed Care – PPO | Admitting: Dermatology

## 2023-05-30 VITALS — BP 111/66 | HR 72

## 2023-05-30 DIAGNOSIS — R439 Unspecified disturbances of smell and taste: Secondary | ICD-10-CM | POA: Diagnosis not present

## 2023-05-30 DIAGNOSIS — L750 Bromhidrosis: Secondary | ICD-10-CM | POA: Diagnosis not present

## 2023-05-30 NOTE — Patient Instructions (Signed)
Hello Andrew Glover,  Thank you for visiting my office today. Your dedication to addressing your health concerns is greatly appreciated, and I'm pleased we had the opportunity to discuss the ongoing issue with smelling an unpleasant odor when sweating. Here is a summary of the key instructions and recommendations from our consultation:  - Antibacterial Soap: Continue using antibacterial soap (CeraVe Benzoyl Peroxide)  as previously advised for improved hygiene.  - Soap Recommendation: Consider switching to Lever 2000 or Dial for a fresher smell and more effective cleansing.  - Benzoyl Peroxide Wash: Use benzoyl peroxide wash under your arms and in the groin area, followed by Lever 2000, for enhanced freshness.  - Body Deodorants: Try all-over body deodorants from brands like Secret or Dove, which are available at stores like [employer] or [employer], to help manage body odor.  - Epsom Salt Baths: Incorporate Epsom salt baths into your routine twice a week.  - Diet and Sweating Diary: Keep a diary of your diet and any instances of sweating or odor to identify potential correlations.  - Dietary Changes: Experiment with adding pineapple to your diet, as it may help make your sweat smell fresher.  - Powder for Legs: Continue using powder to manage sweatness and dampness effectively.  I like the Shower to [employer] brand  - Glycopyrrolate Consideration: We discussed the possibility of using oral glycopyrrolate to reduce sweating.   Please ensure to keep your follow-up appointment in three months. Should you have any concerns or questions before then, do not hesitate to reach out with a MyChart message  Warm regards,  Dr. [facility]   Important Information  Due to recent changes in healthcare laws, you may see results of your pathology and/or laboratory studies on MyChart before the doctors have had a chance to review them. We understand that in some cases there may be results that are  confusing or concerning to you. Please understand that not all results are received at the same time and often the doctors may need to interpret multiple results in order to provide you with the best plan of care or course of treatment. Therefore, we ask that you please give [place] 2 business days to thoroughly review all your results before contacting the office for clarification. Should we see a critical lab result, you will be contacted sooner.   If You Need Anything After Your Visit  If you have any questions or concerns for your doctor, please call our main line at [phone] If no one answers, please leave a voicemail as directed and we will return your call as soon as possible. Messages left after 4 pm will be answered the following business day.   You may also send [place] a message via MyChart. We typically respond to MyChart messages within 1-2 business days.  For prescription refills, please ask your pharmacy to contact our office. Our fax number is [phone].  If you have an urgent issue when the clinic is closed that cannot wait until the next business day, you can page your doctor at the number below.    Please note that while we do our best to be available for urgent issues outside of office hours, we are not available 24/7.   If you have an urgent issue and are unable to reach [place], you may choose to seek medical care at your doctor's office, retail clinic, urgent care center, or emergency room.  If you have a medical emergency, please immediately call 911 or go to the emergency  department. In the event of inclement weather, please call our main line at [phone] for an update on the status of any delays or closures.  Dermatology Medication Tips: Please keep the boxes that topical medications come in in order to help keep track of the instructions about where and how to use these. Pharmacies typically print the medication instructions only on the boxes and not directly on the  medication tubes.   If your medication is too expensive, please contact our office at [phone] or send [place] a message through MyChart.   We are unable to tell what your co-pay for medications will be in advance as this is different depending on your insurance coverage. However, we may be able to find a substitute medication at lower cost or fill out paperwork to get insurance to cover a needed medication.   If a prior authorization is required to get your medication covered by your insurance company, please allow [place] 1-2 business days to complete this process.  Drug prices often vary depending on where the prescription is filled and some pharmacies may offer cheaper prices.  The website www.goodrx.com contains coupons for medications through different pharmacies. The prices here do not account for what the cost may be with help from insurance (it may be cheaper with your insurance), but the website can give you the price if you did not use any insurance.  - You can print the associated coupon and take it with your prescription to the pharmacy.  - You may also stop by our office during regular business hours and pick up a GoodRx coupon card.  - If you need your prescription sent electronically to a different pharmacy, notify our office through [facility] or by phone at [phone]

## 2023-05-30 NOTE — Progress Notes (Signed)
   Follow-Up Visit   Subjective  Andrew Glover is a 62 y.o. male who presents for the following: follow up for Psoriasis.  The following portions of the chart were reviewed this encounter and updated as appropriate: medications, allergies, medical history  Review of Systems:  No other skin or systemic complaints except as noted in HPI or Assessment and Plan.  Objective  Well appearing patient in no apparent distress; mood and affect are within normal limits.   A focused examination was performed of the following areas:  Lower extremity and groin.  Relevant exam findings are noted in the Assessment and Plan.    Assessment & Plan   1. Self percieved smelly sweat all over body - Assessment: Patient reports a persistent unusual odor perception that others cannot detect, beginning after a COVID-19 infection. The odor is described as distinctive and unfamiliar, not localized to any specific body area. Differential diagnoses include post-viral olfactory dysfunction, phantosmia, or parosmia  - Plan:  Consider referral to otolaryngology for further evaluation of olfactory dysfunction. Recommend keeping a food diary to identify potential dietary triggers. Advised to try eating pineapple to potentially improve sweat odor. Follow up in 3 months.  2. Bromohydrosis / c/ mild Hyperhidrosis - Assessment: Patient reports excessive sweating, particularly noticeable when sedentary, accompanied by a perceived unusual odor. The condition appears to be generalized, affecting multiple body areas.  - Plan:  Continue use of benzoyl peroxide wash, including underarms. Switch to Lever 2000 or Dial soap for overall body washing. Use powder on legs to manage wetness. Try aluminum-free deodorants (e.g., Native brand). Recommend all-over body deodorant sprays (e.g., Secret, Dove). Advised to take Epsom salt baths twice weekly. Consider oral glycopyrrolate if symptoms persist, noting potential side  effects.   No follow-ups on file.  Dominga Ferry, Surg Tech III, am acting as scribe for [employer], DO.   Documentation: I have reviewed the above documentation for accuracy and completeness, and I agree with the above.  Langston Reusing, DO

## 2023-06-01 DIAGNOSIS — G4733 Obstructive sleep apnea (adult) (pediatric): Secondary | ICD-10-CM | POA: Diagnosis not present

## 2023-06-18 ENCOUNTER — Ambulatory Visit: Payer: BC Managed Care – PPO | Admitting: Psychiatry

## 2023-06-18 DIAGNOSIS — F319 Bipolar disorder, unspecified: Secondary | ICD-10-CM | POA: Diagnosis not present

## 2023-06-18 NOTE — Progress Notes (Signed)
 [facility] Counselor/Therapist Progress Note  Patient ID: Andrew BATTERSHELL, MRN: [id number],    Date: 06/18/2023  Time Spent: 53 minutes   Treatment Type: Individual Therapy  Reported Symptoms: irritability, anxiety    Mental Status Exam:  Appearance:   Casual     Behavior:  Appropriate, Sharing, and Motivated  Motor:  Normal  Speech/Language:   Clear and Coherent  Affect:  anxious  Mood:  anxious and irritable  Thought process:  goal directed  Thought content:    Rumination  Sensory/Perceptual disturbances:    WNL  Orientation:  oriented to person, place, time/date, situation, day of week, month of year, year, and stated date of Jan. 6, 2025  Attention:  Good  Concentration:  Good  Memory:  WNL  Fund of knowledge:   Good  Insight:    Good  Judgment:   Good  Impulse Control:  Good and Fair   Risk Assessment: Danger to Self:  No Self-injurious Behavior: No Danger to Others: No Duty to Warn:no Physical Aggression / Violence:No  Access to Firearms a concern: No  Gang Involvement:No   Subjective:   Patient in session today reporting anxiety, communication issues, some irritability, and anger and frustration.  Still needing to reduce the intensity of his anger and pay more attention to how he says what he says and his interactions within her marital relationship and others within the family.  Reports that it is difficult for he and wife not to blame each other and for now they have stopped marital counseling with another therapist.  Discussed this with patient today and how it had seemed they had made some progress in marital therapy in terms of their communication.  Per patient's report, they both seem to feel that the other person is the one that needs to do the changing in the relationship, rather than both of them changing in ways that helps to make the relationship healthier. Processed theses thoughts with patient today and he doesn't feel wife was very committed when they  tried marital therapy. Focusing more today on his anxiety, some anger, and frustration in  marriage. Not all details included in this note due to patient privacy needs. Talked about what he wants in his marriage, which is a wife to stand by me, and and me stand by her. Mood today is not very receptive to changes needed but did engage in conversation about  working more with better ways of managing his stress, anger, and frustrations. Denies any thoughts to harm anyone and does want to work through more of his issues related to  wife and other family members although seeing need to set clear boundaries including financial boundaries with them.  Marital communication remains a challenge and patient has been encouraged to work further on his part in the communication as discussed some in session today, including ways of being able to talk about sensitive topics without as much tension, and that include as much listening as talking.  Interventions: Cognitive Behavioral Therapy and Insight-Oriented Reduce overall level, frequency, and intensity of the anxiety so that daily functioning is not impaired. Increase understanding of beliefs and messages that produce the worry and anxiety. Identify and use specific doping strategies for anxiety reduction.   Diagnosis:   ICD-10-CM   1. Bipolar I disorder (HCC)  F31.9      Plan:  Patient participating well in session working further on his marital struggles and communication, healthier boundaries with others, managing anxiety more effectively,  and trying to cope and respond better with stressful and frustrating circumstances within the family.  He has definitely made some progress and needs to continue working with goal-directed behaviors to move in a healthier and more positive direction.  Reminded and encouraged patient in his practice of more positive and self affirming behaviors including: Naming and emphasizing his strengths, paying attention to his anger and  managing it more effectively, remain in contact with supportive people, stay in the present and focused on what he can change, reduce overthinking and over analyzing, letting go of things from the past that hold him back now, practice his improved listening skills to really hear what someone else is saying before responding, practice more patients in situations and relationships, and recognize the strength he shows working with goal-directed behaviors to move in a direction that supports his improved emotional health and his outlook into the future.  Goal review and progress/challenges noted with patient.  Next appointment within 3/4 weeks.   Barnie Bunde, LCSW

## 2023-06-29 DIAGNOSIS — R630 Anorexia: Secondary | ICD-10-CM | POA: Diagnosis not present

## 2023-06-29 DIAGNOSIS — R5383 Other fatigue: Secondary | ICD-10-CM | POA: Diagnosis not present

## 2023-06-29 DIAGNOSIS — G4733 Obstructive sleep apnea (adult) (pediatric): Secondary | ICD-10-CM | POA: Diagnosis not present

## 2023-07-02 ENCOUNTER — Ambulatory Visit: Payer: BC Managed Care – PPO | Admitting: Psychiatry

## 2023-07-02 DIAGNOSIS — F319 Bipolar disorder, unspecified: Secondary | ICD-10-CM | POA: Diagnosis not present

## 2023-07-02 NOTE — Progress Notes (Signed)
[facility] Counselor/Therapist Progress Note  Patient ID: Andrew Glover, MRN: [id number],    Date: 07/02/2023  Time Spent: 53 minutes   Treatment Type: Individual Therapy  Reported Symptoms: irritability improving, anxiety   Mental Status Exam:  Appearance:   Casual     Behavior:  Appropriate, Sharing, and Motivated  Motor:  Normal  Speech/Language:   Clear and Coherent  Affect:  anxious  Mood:  anxious  Thought process:  goal directed  Thought content:    WNL  Sensory/Perceptual disturbances:    WNL  Orientation:  oriented to person, place, time/date, situation, day of week, month of year, year, and stated date of Jan. 20, 2025  Attention:  Good  Concentration:  Good  Memory:  WNL  Fund of knowledge:   Good  Insight:    Good and Fair  Judgment:   Good  Impulse Control:  Good   Risk Assessment: Danger to Self:  No Self-injurious Behavior: No Danger to Others: No Duty to Warn:no Physical Aggression / Violence:No  Access to Firearms a concern: No  Gang Involvement:No   Subjective:  Patient today in session actively participated as he focused further on his anger and frustration (lessened), communication issues, anxiety, and some irritability.  Continued work with him today on reducing the intensity of his anger and being able to make some changes in his communication patterns to where his anger is not expressed as intensely as this does not go well in his conversations with family.  Reports being better able to defuse situations when/if needed. Working today on communication within family and extended family related to money and family issues, and processed some of the communication issues that have arisen recently involving family stressors, disagreements. Trying not to be as blaming. Frustrated with some marital/family situations and hard for him to feel hopeful and making progress there but reports that he is learning how to handle situations within family better, but  relationships still need work. 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  Interventions: Cognitive Behavioral Therapy and Solution-Oriented/Positive Psychology Reduce overall level, frequency, and intensity of the anxiety so that daily functioning is not impaired. Increase understanding of beliefs and messages that produce the worry and anxiety. Identify and use specific doping strategies for anxiety reduction.    Diagnosis:   ICD-10-CM   1. Bipolar I disorder (HCC)  F31.9      Plan:   Patient in session today and showing good motivation and active participation as he focused more on his marital and family communication and struggles, managing anxiety more effectively, healthier boundaries with others, and his efforts to cope and respond better with stressful and frustrating circumstances within the family.  Patient is definitely showing progress and needs to continue working with goal-directed behaviors as he tries to move in a more positive and healthier direction.  Encouraged patient in his practice of more positive and self affirming behaviors including: Naming and emphasizing his strengths, paying attention to his anger and managing it more effectively, remain in contact with supportive people, stay in the present and focused on what he can change, reduce overthinking and over analyzing, letting go of things from the past that hold him back now, practice his improved listening skills to really hear what someone else is saying before responding, practice more patience in situations and relationships, and recognize the strength he shows working with goal-directed behaviors to move in a direction that supports his  improved emotional health and his overall wellbeing.  Goal review and progress/challenges noted with patient.  Next appointment within 3 to 4 weeks   Mathis Fare, LCSW

## 2023-07-19 ENCOUNTER — Ambulatory Visit: Payer: BC Managed Care – PPO | Admitting: Psychiatry

## 2023-07-19 DIAGNOSIS — F319 Bipolar disorder, unspecified: Secondary | ICD-10-CM

## 2023-07-19 NOTE — Progress Notes (Signed)
 [facility] Counselor/Therapist Progress Note  Patient ID: Andrew Glover, MRN: [id number],    Date: 07/19/2023  Time Spent: 53 minutes   Treatment Type: Individual Therapy  Reported Symptoms:  bipolar, anxiety, less irritable    Mental Status Exam:  Appearance:   Casual and Neat     Behavior:  Appropriate, Sharing, and Motivated  Motor:  Normal  Speech/Language:   Clear and Coherent  Affect:  anxious  Mood:  anxious  Thought process:  goal directed  Thought content:    WNL  Sensory/Perceptual disturbances:    WNL  Orientation:  oriented to person, place, time/date, situation, day of week, month of year, year, and stated date of Feb. 6, 2025  Attention:  Good  Concentration:  Good  Memory:  WNL  Fund of knowledge:   Good  Insight:    Good  Judgment:   Good  Impulse Control:  Good and Fair   Risk Assessment: Danger to Self:  No Self-injurious Behavior: No Danger to Others: No Duty to Warn:no Physical Aggression / Violence:No  Access to Firearms a concern: No  Gang Involvement:No   Subjective:    Patient in session today participating well and working more on his anger and frustration, anxiety, some irritability, and communication issues within his marriage and extended family.  Has made some noticeable gains per his report and realizing the need for further reduction in the intensity of his anger, better communication when he is experiencing anger, and being able to to let go things that he feels can trip him up but they really are not that important.  More work today in session on reducing the intensity of his anger and having better communication patterns even in the midst of anger including being able to take breaks when needed to help diffuse situations.  Working further on family and marital communication, and worked with some more specific examples of these communication issues within his family.  Encouraging him to let go of blaming and trying to be more a  part of problem resolution.  Easily frustrated with certain family/marital situations and hard for him to let go in order to move in a more forward direction.  Discussed with patient what he felt it would take for him to let go more easily and that was hard for him to answer.  Encouraged continued work on not blaming, but rather being a part of the solution within family communication.  Interventions: Cognitive Behavioral Therapy and Ego-Supportive  Reduce overall level, frequency, and intensity of the anxiety so that daily functioning is not impaired. Increase understanding of beliefs and messages that produce the worry and anxiety. Identify and use specific doping strategies for anxiety reduction.   Diagnosis:   ICD-10-CM   1. Bipolar I disorder (HCC)  F31.9      Plan:  Patient today in session actively participating and with good motivation focusing further on his family and marital communication and struggles, managing anxiety in healthier ways, use of good boundaries with others, and trying to cope and respond better with stressful/frustrating circumstances within the family particularly extended family. Reminded and encouraged patient in his practice of more positive and self affirming behaviors including: Naming and emphasizing his strengths, paying attention to his anger and managing it more effectively, remain in contact with supportive people, stay in the present and focused on what he can change, reduce overthinking and over analyzing, letting go of things from the past that can hold him back now,  practice his improved listening skills to really hear what someone else is saying before he responds, practice more patients in situations and relationships, and realize the strength he shows working with goal-directed behaviors to move in a direction that supports his overall emotional health and his outlook into the future. This patient has made progress and definitely needs to keep working  with his treatment goal behaviors to move in a more hopeful and healthier direction.  Goal review and progress/challenges noted with patient.  Next appointment within 3 to 4 weeks.   Barnie Bunde, LCSW

## 2023-07-30 DIAGNOSIS — G4733 Obstructive sleep apnea (adult) (pediatric): Secondary | ICD-10-CM | POA: Diagnosis not present

## 2023-07-31 ENCOUNTER — Encounter: Payer: Self-pay | Admitting: Adult Health

## 2023-07-31 ENCOUNTER — Ambulatory Visit: Payer: BC Managed Care – PPO | Admitting: Adult Health

## 2023-07-31 DIAGNOSIS — F319 Bipolar disorder, unspecified: Secondary | ICD-10-CM

## 2023-07-31 DIAGNOSIS — F411 Generalized anxiety disorder: Secondary | ICD-10-CM

## 2023-07-31 DIAGNOSIS — G4700 Insomnia, unspecified: Secondary | ICD-10-CM | POA: Diagnosis not present

## 2023-07-31 NOTE — Progress Notes (Signed)
 [name] [id number] August 07, 1960 63 y.o.  Subjective:   Patient ID:  Andrew Glover is a 63 y.o. (DOB 09-Aug-1960) male.  Chief Complaint: No chief complaint on file.   HPI Andrew Glover presents to the office today for follow-up of BPD-1, GAD, and insomnia.  Describes mood today as "ok". Pleasant. Denies tearfulness. Mood symptoms - denies depression,  anxiety and irritability. Denies panic attacks. Reports some over thinking. Denies obsessive thoughts and acts. Mood is stable. Stating "I feel like I'm doing alright". Has increased Lamictal back to 200mg  daily and feels it has been helpful. Reports minimal issues with leg movements. Stable interest and motivation. Taking medications as prescribed.  Energy levels good. Active, does not have a regular exercise routine.  Enjoys some usual interests and activities. Married. Lives with wife. Has 3 step children. Spending time with family and friends. Appetite adequate. Weight 203 pounds. Sleeps well most nights. Averages 6 hours. Focus and concentration stable. Completing tasks. Managing aspects of household. Works at [employer]. Denies SI or HI.  Denies AH or VH. Denies self harm. Denies substance use.  Previous medication trials: Abilify, Lamictal, Latuda, Lithium, Rexulti, Trileptal, Geodon   GAD-7    Flowsheet Row Office Visit from 09/20/2021 in [facility] [employer] Visit from 12/16/2020 in [facility] at [employer]  Total GAD-7 Score 1 10      PHQ2-9    Flowsheet Row Office Visit from 09/20/2021 in [facility] Visit from 03/18/2021 in [facility] Visit from 12/16/2020 in [facility] [employer]  PHQ-2 Total Score 0 0 0  PHQ-9 Total Score 0 -- 10      Flowsheet Row ED from 03/16/2023 in [facility] Emergency Department at [facility]  C-SSRS RISK  CATEGORY No Risk        Review of Systems:  Review of Systems  Musculoskeletal:  Negative for gait problem.  Neurological:  Negative for tremors.  Psychiatric/Behavioral:         Please refer to HPI    Medications: I have reviewed the patient's current medications.  Current Outpatient Medications  Medication Sig Dispense Refill   ascorbic acid (VITAMIN C) 1000 MG tablet Take 1,000 mg by mouth daily.     aspirin 81 MG EC tablet Take 1 tablet by mouth every morning.     atenolol-chlorthalidone (TENORETIC) 50-25 MG tablet Take 1 tablet by mouth daily. 30 tablet 1   atorvastatin (LIPITOR) 40 MG tablet Take 1 tablet (40 mg total) by mouth daily. Take 40 mg by mouth daily. 30 tablet 1   cetirizine (ZYRTEC) 10 MG tablet Take 10 mg by mouth daily.     clobetasol cream (TEMOVATE) 0.05 % Apply 1 Application topically as needed.     ergocalciferol (VITAMIN D2) 1.25 MG (50000 UT) capsule Take by mouth.     lamoTRIgine (LAMICTAL) 100 MG tablet Take 1 tablet (100 mg total) by mouth 2 (two) times daily. 180 tablet 1   losartan (COZAAR) 100 MG tablet Take 1 tablet (100 mg total) by mouth daily. 30 tablet 1   metFORMIN (GLUCOPHAGE-XR) 500 MG 24 hr tablet Take 500 mg by mouth 2 (two) times daily.     methylPREDNISolone (MEDROL) 4 MG tablet Taper from 6 pills po for one day to 1 pill po the last day over 6 days 21 tablet 0   Multiple Vitamin (MULTIVITAMIN PO)  Take 1 tablet by mouth daily.     Roflumilast (ZORYVE) 0.3 % CREA Apply 1 Application topically daily. 60 g 3   triamcinolone cream (KENALOG) 0.1 % Apply 1 application topically 2 (two) times daily. (Patient taking differently: Apply 1 application  topically as needed.) 30 g 0   No current facility-administered medications for this visit.    Medication Side Effects: None  Allergies:  Allergies  Allergen Reactions   Aripiprazole     Other Reaction(s): akathisias, weight gain on 5 mg daily (43 lbs over 6 yrs.Only 6 lbs and no akathisias over  2 years.   Brexpiprazole     Other Reaction(s): itchy ankles, not effective on 1.5 mg qd   Lamotrigine     Other Reaction(s): no SE, not effective   Lithium     Other Reaction(s): no SE, not effective   Lurasidone     Other Reaction(s): increased appetite, not as "laid back:   Quetiapine     Other Reaction(s): sleepy, increased weight, not effective   Sulfa Antibiotics Hives and Itching   Ziprasidone     Other Reaction(s): drowsiness on 40 mg twice daily   Codeine Itching and Rash   Sulfamethoxazole Rash   Sulfamethoxazole-Trimethoprim Rash    Past Medical History:  Diagnosis Date   Bipolar 1 disorder (HCC)    Hyperlipidemia    Hypertension     Past Medical History, Surgical history, Social history, and Family history were reviewed and updated as appropriate.   Please see review of systems for further details on the patient's review from today.   Objective:   Physical Exam:  There were no vitals taken for this visit.  Physical Exam Constitutional:      General: He is not in acute distress. Musculoskeletal:        General: No deformity.  Neurological:     Mental Status: He is alert and oriented to person, place, and time.     Coordination: Coordination normal.  Psychiatric:        Attention and Perception: Attention and perception normal. He does not perceive auditory or visual hallucinations.        Mood and Affect: Affect is not labile, blunt, angry or inappropriate.        Speech: Speech normal.        Behavior: Behavior normal.        Thought Content: Thought content normal. Thought content is not paranoid or delusional. Thought content does not include homicidal or suicidal ideation. Thought content does not include homicidal or suicidal plan.        Cognition and Memory: Cognition and memory normal.        Judgment: Judgment normal.     Comments: Insight intact     Lab Review:     Component Value Date/Time   NA 140 03/16/2023 2131   K 3.7 03/16/2023  2131   CL 104 03/16/2023 2131   CO2 29 03/16/2023 2131   GLUCOSE 144 (H) 03/16/2023 2131   BUN 19 03/16/2023 2131   CREATININE 0.94 03/16/2023 2131   CALCIUM 9.2 03/16/2023 2131   PROT 6.5 03/16/2023 2131   ALBUMIN 4.2 03/16/2023 2131   AST 18 03/16/2023 2131   ALT 21 03/16/2023 2131   ALKPHOS 78 03/16/2023 2131   BILITOT 0.5 03/16/2023 2131   GFRNONAA >60 03/16/2023 2131   GFRAA >90 03/20/2011 1955       Component Value Date/Time   WBC 8.3 03/16/2023 2131   RBC 5.07 03/16/2023  2131   HGB 15.8 03/16/2023 2131   HCT 44.2 03/16/2023 2131   PLT 187 03/16/2023 2131   MCV 87.2 03/16/2023 2131   MCH 31.2 03/16/2023 2131   MCHC 35.7 03/16/2023 2131   RDW 13.4 03/16/2023 2131   LYMPHSABS 2.2 03/16/2023 2131   MONOABS 0.6 03/16/2023 2131   EOSABS 0.2 03/16/2023 2131   BASOSABS 0.0 03/16/2023 2131    No results found for: "POCLITH", "LITHIUM"   No results found for: "PHENYTOIN", "PHENOBARB", "VALPROATE", "CBMZ"   .res Assessment: Plan:    Plan:  PDMP reviewed  Working with therapist - Rockne Menghini  Lamictal 100mg  at hs and 100mg  in the morning.   RTC 4 weeks  Patient advised to contact office with any questions, adverse effects, or acute worsening in signs and symptoms.  Counseled patient regarding potential benefits, risks, and side effects of Lamictal to include potential risk of Stevens-Johnson syndrome. Advised patient to stop taking Lamictal and contact office immediately if rash develops and to seek urgent medical attention if rash is severe and/or spreading quickly.   Diagnoses and all orders for this visit:  Bipolar I disorder (HCC)  Generalized anxiety disorder  Insomnia, unspecified type     Please see After Visit Summary for patient specific instructions.  Future Appointments  Date Time Provider Department Center  08/09/2023  5:00 PM Mathis Fare, [place] [facility] None  08/14/2023  3:45 PM Terri Piedra, DO [facility] None  08/30/2023  5:00 PM Mathis Fare, LCSW [facility] None  09/12/2023  5:00 PM Mathis Fare, LCSW [facility] None  09/26/2023  5:00 PM Mathis Fare, LCSW [facility] None  10/10/2023  5:00 PM Mathis Fare, LCSW [facility] None  10/25/2023  5:00 PM Mathis Fare, LCSW [facility] None    No orders of the defined types were placed in this encounter.   -------------------------------

## 2023-08-09 ENCOUNTER — Ambulatory Visit: Payer: BC Managed Care – PPO | Admitting: Psychiatry

## 2023-08-09 DIAGNOSIS — F319 Bipolar disorder, unspecified: Secondary | ICD-10-CM

## 2023-08-09 NOTE — Progress Notes (Signed)
 [facility] Counselor/Therapist Progress Note  Patient ID: Andrew Glover, MRN: [id number],    Date: 08/09/2023  Time Spent: 55 minutes   Treatment Type: Individual Therapy  Reported Symptoms:  "bipolar", anxiety, some decrease in irritability and is a "work in progress"   Mental Status Exam:  Appearance:   Casual     Behavior:  Appropriate, Sharing, and Motivated  Motor:  Normal  Speech/Language:   Clear and Coherent  Affect:  Anxious, irritability  Mood:  anxious and irritable  Thought process:  goal directed  Thought content:    WNL  Sensory/Perceptual disturbances:    WNL  Orientation:  oriented to person, place, time/date, situation, day of week, month of year, year, and stated date of Feb. 27, 2025  Attention:  Good  Concentration:  Good and Fair  Memory:  WNL  Fund of knowledge:   Good  Insight:    Good and Fair  Judgment:   Good and Fair  Impulse Control:  Good and Fair   Risk Assessment: Danger to Self:  No Self-injurious Behavior: No Danger to Others: No Duty to Warn:no Physical Aggression / Violence:No  Access to Firearms a concern: No  Gang Involvement:No   Subjective:  Patient today continues his work on better managing his anxiety, irritability, anger, and frustration, along with some "stressful" communication issues  in his marital relationship and in other family relationships. Needed session today to share and vent about some recent marital conflict. Had been in marital counseling previously but had a negative experience and very "turned off" and left and will not consider it at this time. We continue today working with patient on his irritability and his anxiety, especially as patient gets easily triggered at times. Shared about a coworker that he has "issues with" and have had problems before at work and "I have learned to just not pay him any attention and stay out of his way." Reports that he has made some improvements in his anger, and is continuing  to work on it. Shares that his small dog is a  "real positive" for him. Working to be less blaming, trying not to let angry feelings accumulate, reduce the intensity of his anger and improve communication, although he struggles with losing motivation at times. Encouraging him to see some of his positives and focus less on the negatives, including naming several positives that he feels in trying to work on family relationships.  Working "harder" on issues re: blame, and plan to work on this more next session.    Interventions: Cognitive Behavioral Therapy and Ego-Supportive  Reduce overall level, frequency, and intensity of the anxiety so that daily functioning is not impaired. Increase understanding of beliefs and messages that produce the worry and anxiety. Identify and use specific doping strategies for anxiety reduction.    Diagnosis:   ICD-10-CM   1. Bipolar I disorder (HCC)  F31.9      Plan:   Patient today in session showing good effort and motivation as he continues to work on family and marital challenges including communication and behavioral.  Also working to more effectively manage his anxiety in healthier ways, have good boundaries with others and trying to respond and recognize better ways of his management with stress, frustrations, and certain circumstances within the family, extended family, and sometimes beyond the family.  Encouraged patient in his practice of more positive and self affirming behaviors as noted in session including: Paying attention to his anger and managing it  more effectively, remaining in contact with supportive people, stay in the present and focused on what he can change, naming and emphasizing his strengths as well as his need areas, reduce overthinking and over analyzing, letting go of things from the past that can hold him back now, practice improved listening skills to really hear what someone else is saying before he responds, practice more patience in  situations and relationships, and recognize the strength he shows working with goal-directed behaviors to move in a direction that supports his overall emotional health and wellbeing.  Patient has made progress and definitely needs to keep working with his goal-directed behaviors in order to move in a more hopeful and healthier direction.  Goal review and progress/challenges noted with patient.  Next appointment within 3 to 4 weeks.   Mathis Fare, LCSW

## 2023-08-14 ENCOUNTER — Ambulatory Visit: Payer: BC Managed Care – PPO | Admitting: Dermatology

## 2023-08-14 ENCOUNTER — Encounter: Payer: Self-pay | Admitting: Dermatology

## 2023-08-14 VITALS — BP 112/56

## 2023-08-14 DIAGNOSIS — L750 Bromhidrosis: Secondary | ICD-10-CM | POA: Diagnosis not present

## 2023-08-14 DIAGNOSIS — L409 Psoriasis, unspecified: Secondary | ICD-10-CM | POA: Diagnosis not present

## 2023-08-14 MED ORDER — ZORYVE 0.3 % EX CREA: 1.0000 | TOPICAL_CREAM | Freq: Every day | CUTANEOUS | 3 refills | Status: AC

## 2023-08-14 NOTE — Patient Instructions (Addendum)
 Thank you for visiting my office today.   Below is a summary of our discussion and the forward plan for your treatment:  Zoryve Cream: Continue applying Zoryve cream daily to the affected areas as previously prescribed.  Gold Bond Powder: To manage sweating and discomfort in the groin area, consider using Gold Bond powder.  Hygiene Maintenance: Regular use of Dial soap is recommended to maintain hygiene.  Unusual Smell: I still do not appreciate any mal odor on physical exam. Should you continue to notice an unusual smell, which may be due to changes in body chemistry, seeking a second opinion could be beneficial. Dr. Katrinka Blazing is who I recommend for a second opinion.  Epsom Salt Baths: After showering, try taking Epsom salt baths to aid with skin symptoms.  New Prescription: I will send a new prescription for Zoryve with a mini refill to address the application issues you mentioned.  Follow-Up Appointment: Please schedule a follow-up appointment in 6 months to reassess your condition and the efficacy of your treatment.  Should you have any questions or concerns before our next scheduled visit, do not hesitate to reach out.  Warm regards,  Dr. Langston Reusing [facility]    [facility] [phone]   Important Information  Due to recent changes in healthcare laws, you may see results of your pathology and/or laboratory studies on MyChart before the doctors have had a chance to review them. We understand that in some cases there may be results that are confusing or concerning to you. Please understand that not all results are received at the same time and often the doctors may need to interpret multiple results in order to provide you with the best plan of care or course of treatment. Therefore, we ask that you please give [place] 2 business days to thoroughly review all your results before contacting the office for clarification. Should we see a critical lab result, you will  be contacted sooner.   If You Need Anything After Your Visit  If you have any questions or concerns for your doctor, please call our main line at [phone] If no one answers, please leave a voicemail as directed and we will return your call as soon as possible. Messages left after 4 pm will be answered the following business day.   You may also send [place] a message via MyChart. We typically respond to MyChart messages within 1-2 business days.  For prescription refills, please ask your pharmacy to contact our office. Our fax number is [phone].  If you have an urgent issue when the clinic is closed that cannot wait until the next business day, you can page your doctor at the number below.    Please note that while we do our best to be available for urgent issues outside of office hours, we are not available 24/7.   If you have an urgent issue and are unable to reach [place], you may choose to seek medical care at your doctor's office, retail clinic, urgent care center, or emergency room.  If you have a medical emergency, please immediately call 911 or go to the emergency department. In the event of inclement weather, please call our main line at [phone] for an update on the status of any delays or closures.  Dermatology Medication Tips: Please keep the boxes that topical medications come in in order to help keep track of the instructions about where and how to use these. Pharmacies typically print the medication instructions only on the  boxes and not directly on the medication tubes.   If your medication is too expensive, please contact our office at [phone] or send [place] a message through MyChart.   We are unable to tell what your co-pay for medications will be in advance as this is different depending on your insurance coverage. However, we may be able to find a substitute medication at lower cost or fill out paperwork to get insurance to cover a needed medication.   If a prior  authorization is required to get your medication covered by your insurance company, please allow [place] 1-2 business days to complete this process.  Drug prices often vary depending on where the prescription is filled and some pharmacies may offer cheaper prices.  The website www.goodrx.com contains coupons for medications through different pharmacies. The prices here do not account for what the cost may be with help from insurance (it may be cheaper with your insurance), but the website can give you the price if you did not use any insurance.  - You can print the associated coupon and take it with your prescription to the pharmacy.  - You may also stop by our office during regular business hours and pick up a GoodRx coupon card.  - If you need your prescription sent electronically to a different pharmacy, notify our office through [facility] or by phone at [phone]

## 2023-08-14 NOTE — Progress Notes (Signed)
   Follow-Up Visit   Subjective  Andrew Glover is a 63 y.o. male who presents for the following: Tinea cruris/Burning Scrotal Syndrome - He was using Tacrolimus but he stopped and started [facility] and it has helped a lot. He did have psoriasis on his legs that he was prescribed Zoryve for but it went away. 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 He tried BP wash but it burned so he switched to Dial Antibacterial soap.    The following portions of the chart were reviewed this encounter and updated as appropriate: medications, allergies, medical history  Review of Systems:  No other skin or systemic complaints except as noted in HPI or Assessment and Plan.  Objective  Well appearing patient in no apparent distress; mood and affect are within normal limits.   A focused examination was performed of the following areas: groin area   Relevant exam findings are noted in the Assessment and Plan.    Assessment & Plan   PSORIASIS Exam: Clear today   Treatment Plan: Discussed Otezla vs Rinvoq. Patient prefers to stay away from systemic treatments for now.  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  Treatment Plan: Continue Dial Antibacterial soap daily. Recommend Epsom Salt bath after his shower and Gold Bond Powder.  He states the scent is strong to him and smells bad. He says that his wife says she can not smell anything. Advised him that he could get a second opinion if he would like and we would recommend he see Dr Katrinka Blazing at [facility] in [place].     Return in about 6 months (around 02/14/2024).  I, Joanie Coddington, CMA, am acting as scribe for [employer], DO .   Documentation: I have reviewed the above documentation for accuracy and completeness, and I agree with the above.  Langston Reusing, DO

## 2023-08-16 ENCOUNTER — Encounter: Payer: Self-pay | Admitting: Dermatology

## 2023-08-21 NOTE — Telephone Encounter (Signed)
 The pics look like hives.  That means its a reaction to something coming in contact with his skin (deoderant, laundry detergent, fabric softeners etc)  He can continue using Zoryve for itch releaf however it won't stop returning until he identifies what the cause is.  Please call patient and let him know this.  Thanks!  - Dr Onalee Hua

## 2023-08-21 NOTE — Telephone Encounter (Signed)
 Pt's wife has been informed and pt is currently at work. She expressed understanding and will pass the information along to the pt once he returns home.

## 2023-08-27 DIAGNOSIS — G4733 Obstructive sleep apnea (adult) (pediatric): Secondary | ICD-10-CM | POA: Diagnosis not present

## 2023-08-30 ENCOUNTER — Ambulatory Visit: Payer: BC Managed Care – PPO | Admitting: Psychiatry

## 2023-08-30 DIAGNOSIS — F319 Bipolar disorder, unspecified: Secondary | ICD-10-CM | POA: Diagnosis not present

## 2023-08-30 DIAGNOSIS — G4733 Obstructive sleep apnea (adult) (pediatric): Secondary | ICD-10-CM | POA: Diagnosis not present

## 2023-08-30 NOTE — Progress Notes (Signed)
 [facility] Counselor/Therapist Progress Note  Patient ID: Andrew Glover, MRN: [id number],    Date: 08/30/2023  Time Spent: 50 minutes   Treatment Type: Individual Therapy  Reported Symptoms: "bipolar", anxiety, continued decrease in his irritability  Mental Status Exam:  Appearance:   Casual     Behavior:  Appropriate, Sharing, and Motivated  Motor:  Normal  Speech/Language:   Clear and Coherent  Affect:  anxiety  Mood:  anxious  Thought process:  normal  Thought content:    WNL  Sensory/Perceptual disturbances:    WNL  Orientation:  oriented to person, place, time/date, situation, day of week, month of year, year, and stated date of August 30, 2023  Attention:  Good  Concentration:  Good  Memory:  WNL  Fund of knowledge:   Good  Insight:    Good and Fair  Judgment:   Good and Fair  Impulse Control:  Good and Fair   Risk Assessment: Danger to Self:  No Self-injurious Behavior: No Danger to Others: No Duty to Warn:no Physical Aggression / Violence:No  Access to Firearms a concern: No  Gang Involvement:No   Subjective:   Patient today working further on better management of anxiety, anger, stress, frustrations, irritability, and his communication challenges in the marital relationship and other family relationships. Does report "home life is some better, some less arguing, remaining alcohol free so far and previously was drinking multiple beers daily. Some slight improvement in communication with wife. States that when he was drinking in the past he used to let people run over him and now he has healthier boundaries. Still some communication issues with wife.  Discussed (with examples) healthier communication strategies including trying to improve listening skills and thinking before speaking, not interrupting each other, and ask for clarification as needed. As discussed last session, continued work needed with patient and wife however they quit going to marital  counseling. Patient states today however that he and wife has made some progress, mainly in setting some boundaries, and in trying to use better communication skills including improved listening. Irritability seems to be decreased and patient confirms that, and he tends to be honest about his behavior even when it is not positive. Setting limits with some people at work in healthier ways. Enjoys his dog and feels that helps calm him at times when stressed. Trying to make healthy choices. Encouraged continued working on family relationships especially in the area of communication and making decisions and to continue this between sessions. (Not all details included in this note due to patient privacy needs).   Interventions: Cognitive Behavioral Therapy and Ego-Supportive  Reduce overall level, frequency, and intensity of the anxiety so that daily functioning is not impaired. Increase understanding of beliefs and messages that produce the worry and anxiety. Identify and use specific doping strategies for anxiety reduction.   Diagnosis:   ICD-10-CM   1. Bipolar I disorder (HCC)  F31.9      Plan:  Patient in session today and continues to show good effort and talking through his work on treatment goals and being able to notice areas of progress as well as some continued areas where he needs to focus especially with and marriage and family challenges which typically relate to either his communication or behavioral interactions.  Has made some progress on his anxiety and managing it in more healthy ways, better management of frustrations and stress although this is a work in progress, trying to have healthier boundaries with others,  and working on improved stress management.  Reminded and encouraged patient in his practice of more positive and self affirming behaviors as we discussed in session including: Pay attention to his anger and managing it more effectively in which he has shown progress, stay in  contact with supportive people, stay in the present and focused on what he can change, naming and emphasizing his strengths as well as his need areas, reduce overthinking and over analyzing, letting go of things from the past that can hold him back now, practice improved listening skills to really hear what someone else is saying before he responds, practice more patients in situations and relationships, and realize the strength he shows when working with goal-directed behaviors to move in a direction that supports his overall improving emotional health and wellbeing.  Andrew Glover has shown some progress and definitely needs to keep up his work with his goal-directed behaviors to keep moving and a more healthy and hopeful direction.  Goal review and progress/challenges noted with patient.  Next appointment within 3 to 4 weeks.   Mathis Fare, LCSW

## 2023-09-05 DIAGNOSIS — G4733 Obstructive sleep apnea (adult) (pediatric): Secondary | ICD-10-CM | POA: Diagnosis not present

## 2023-09-11 ENCOUNTER — Other Ambulatory Visit: Payer: Self-pay | Admitting: Family Medicine

## 2023-09-11 DIAGNOSIS — Z122 Encounter for screening for malignant neoplasm of respiratory organs: Secondary | ICD-10-CM

## 2023-09-12 ENCOUNTER — Ambulatory Visit: Payer: BC Managed Care – PPO | Admitting: Psychiatry

## 2023-09-12 DIAGNOSIS — F319 Bipolar disorder, unspecified: Secondary | ICD-10-CM | POA: Diagnosis not present

## 2023-09-12 NOTE — Progress Notes (Signed)
 [facility] Counselor/Therapist Progress Note  Patient ID: Andrew Glover, MRN: [id number],    Date: 09/12/2023  Time Spent: 55 minutes   Treatment Type: Individual Therapy  Reported Symptoms:  "bipolar", anxiety, decreasing irritability   Mental Status Exam:  Appearance:   Casual     Behavior:  Appropriate, Sharing, and Motivated  Motor:  Normal  Speech/Language:   Clear and Coherent  Affect:  Anxiety, "bipolar"  Mood:  anxious  Thought process:  goal directed  Thought content:    WNL  Sensory/Perceptual disturbances:    WNL  Orientation:  oriented to person, place, time/date, situation, day of week, month of year, year, and stated date of September 12, 2023  Attention:  Good  Concentration:  Good  Memory:  WNL  Fund of knowledge:   Good  Insight:    Good and Fair  Judgment:   Good and Fair  Impulse Control:  Good   Risk Assessment: Danger to Self:  No Self-injurious Behavior: No Danger to Others: No Duty to Warn:no Physical Aggression / Violence:No  Access to Firearms a concern: No  Gang Involvement:No   Subjective:   Patient in session today and focusing more on his stress, anger, anxiety, frustrations, irritability, and overall communication challenges especially within the family and marital relationships. "Mostly my bipolar". Today focusing and sharing more how his "upbringing" has influenced him over his growing up years and into adulthood. Shared openly and especially how some of his past has affected him over the years. Patient shared several examples today and how he has become "much more stable." (Not all details included in this note due to patient privacy needs.) "Over all home life remains some better, less hostility and arguing, patient denies andy alcohol abuse, and working more on communication with wife has improved some but needs some further work.  Also focused on his positives of decreased irritability which he reported early in the session.  Reports being  less angry and some situations that he normally would have been, less volatile with his anger, and overall less irritability.  We agreed that this is progress for him.  To continue working on improved communication with wife even though they refused any further marital counseling from a therapist outside of our practice.  From patient's description, it sounded as though it was hard for he and wife to communicate very effectively in the same room.  States that they are doing better "on her own", and with him continuing to receive individual counseling here at our office.  Encouraged him to continue working on healthy communication strategies including not interrupting each other, and also improved listening skills and thinking before speaking as we have discussed in sessions.  Patient reports he continues to set limits with certain people at work and that he does this in "healthier ways" as discussed in session.  Really enjoys his dog and feels that is helpful in calming him when under stress.  To continue focusing on healthier choices as discussed in session.  Interventions: Cognitive Behavioral Therapy and Ego-Supportive  Reduce overall level, frequency, and intensity of the anxiety so that daily functioning is not impaired. Increase understanding of beliefs and messages that produce the worry and anxiety. Identify and use specific doping strategies for anxiety reduction.   Diagnosis:   ICD-10-CM   1. Bipolar I disorder (HCC)  F31.9      Plan:  Patient today in session and continued active participation and motivation and working on his treatment  goals.  We pointed out areas of noticeable progress as well as the areas where he continues to need more focus and work especially within marital and family challenges (behavioral and communication).  He has made some progress on his anxiety and trying to better manage it in constructive ways, improved management of stress and frustrations which he continues  to work on, and also his efforts to have healthier boundaries especially within the family.  Encouraged patient in his practice of more positive and self affirming behaviors as we discussed in session including: Pay attention to his anger and manage it more effectively in which he has shown progress, stay in contact with supportive people, remain in the present and focused on what he can change, name and emphasize his strengths as well as his need areas, reduce overthinking and over analyzing, letting go of things from the past that can hold him back now, practice improved listening skills to really hear with the other person is saying before he responds, practice more patience in situations and relationships, and recognize the strength he shows when working with goal-directed behaviors to move in a forward direction that supports his overall improving emotional health and his outlook into the future.  Aymen Widrig has made some progress and needs to continue his work with goal-directed behaviors to keep moving in a healthier and more hopeful direction for the future.  Goal review and progress/challenges noted with patient.  Next appointment within 3 weeks.   Mathis Fare, LCSW

## 2023-09-17 DIAGNOSIS — G4700 Insomnia, unspecified: Secondary | ICD-10-CM | POA: Diagnosis not present

## 2023-09-17 DIAGNOSIS — B356 Tinea cruris: Secondary | ICD-10-CM | POA: Diagnosis not present

## 2023-09-17 DIAGNOSIS — J309 Allergic rhinitis, unspecified: Secondary | ICD-10-CM | POA: Diagnosis not present

## 2023-09-25 ENCOUNTER — Ambulatory Visit: Payer: BC Managed Care – PPO | Admitting: Adult Health

## 2023-09-25 ENCOUNTER — Encounter: Payer: Self-pay | Admitting: Adult Health

## 2023-09-25 DIAGNOSIS — F411 Generalized anxiety disorder: Secondary | ICD-10-CM | POA: Diagnosis not present

## 2023-09-25 DIAGNOSIS — F319 Bipolar disorder, unspecified: Secondary | ICD-10-CM

## 2023-09-25 DIAGNOSIS — G4700 Insomnia, unspecified: Secondary | ICD-10-CM

## 2023-09-25 MED ORDER — LAMOTRIGINE 100 MG PO TABS
100.0000 mg | ORAL_TABLET | Freq: Two times a day (BID) | ORAL | 1 refills | Status: DC
Start: 2023-09-25 — End: 2024-04-10

## 2023-09-25 MED ORDER — TRAZODONE HCL 50 MG PO TABS
50.0000 mg | ORAL_TABLET | Freq: Every day | ORAL | 2 refills | Status: DC
Start: 2023-09-25 — End: 2024-04-10

## 2023-09-25 NOTE — Progress Notes (Signed)
 Andrew Glover [id number] 1961/04/06 63 y.o.  Virtual Visit via Video Note  I connected with pt @ on 09/25/23 at  4:30 PM EDT by a video enabled telemedicine application and verified that I am speaking with the correct person using two identifiers.   I discussed the limitations of evaluation and management by telemedicine and the availability of in person appointments. The patient expressed understanding and agreed to proceed.  I discussed the assessment and treatment plan with the patient. The patient was provided an opportunity to ask questions and all were answered. The patient agreed with the plan and demonstrated an understanding of the instructions.   The patient was advised to call back or seek an in-person evaluation if the symptoms worsen or if the condition fails to improve as anticipated.  I provided 25 minutes of non-face-to-face time during this encounter.  The patient was located at home.  The provider was located at [facility].   Dorothyann Gibbs, NP   Subjective:   Patient ID:  Andrew Glover is a 63 y.o. (DOB Feb 02, 1961) male.  Chief Complaint: No chief complaint on file.   HPI Andrew Glover presents for follow-up of BPD-1, GAD, and insomnia.  Describes mood today as "ok". Pleasant. Denies tearfulness. Mood symptoms - denies depression,  anxiety and irritability. Denies panic attacks. Reports some over thinking. Denies obsessive thoughts and acts. Mood is stable. Stating "I feel like I'm doing alright". Has increased Lamictal back to 200mg  daily and feels it has been helpful. Reports minimal issues with leg movements. Stable interest and motivation. Taking medications as prescribed.  Energy levels good. Active, does not have a regular exercise routine.  Enjoys some usual interests and activities. Married. Lives with wife. Has 3 step children. Spending time with family and friends. Appetite adequate. Weight 203 pounds. Sleeps well most nights. Averages 6  hours. Focus and concentration stable. Completing tasks. Managing aspects of household. Works at [employer]. Denies SI or HI.  Denies AH or VH. Denies self harm. Denies substance use.  Previous medication trials: Abilify, Lamictal, Latuda, Lithium, Rexulti, Trileptal, Geodon  Review of Systems:  Review of Systems  Musculoskeletal:  Negative for gait problem.  Neurological:  Negative for tremors.  Psychiatric/Behavioral:         Please refer to HPI    Medications: I have reviewed the patient's current medications.  Current Outpatient Medications  Medication Sig Dispense Refill   traZODone (DESYREL) 50 MG tablet Take 1 tablet (50 mg total) by mouth at bedtime. 30 tablet 2   ascorbic acid (VITAMIN C) 1000 MG tablet Take 1,000 mg by mouth daily.     aspirin 81 MG EC tablet Take 1 tablet by mouth every morning.     atenolol-chlorthalidone (TENORETIC) 50-25 MG tablet Take 1 tablet by mouth daily. 30 tablet 1   atorvastatin (LIPITOR) 40 MG tablet Take 1 tablet (40 mg total) by mouth daily. Take 40 mg by mouth daily. 30 tablet 1   cetirizine (ZYRTEC) 10 MG tablet Take 10 mg by mouth daily.     clobetasol cream (TEMOVATE) 0.05 % Apply 1 Application topically as needed.     ergocalciferol (VITAMIN D2) 1.25 MG (50000 UT) capsule Take by mouth.     lamoTRIgine (LAMICTAL) 100 MG tablet Take 1 tablet (100 mg total) by mouth 2 (two) times daily. 180 tablet 1   losartan (COZAAR) 100 MG tablet Take 1 tablet (100 mg total) by mouth daily. 30 tablet 1   metFORMIN (GLUCOPHAGE-XR) 500  MG 24 hr tablet Take 500 mg by mouth 2 (two) times daily.     methylPREDNISolone (MEDROL) 4 MG tablet Taper from 6 pills po for one day to 1 pill po the last day over 6 days 21 tablet 0   Multiple Vitamin (MULTIVITAMIN PO) Take 1 tablet by mouth daily.     Roflumilast (ZORYVE) 0.3 % CREA Apply 1 Application topically daily. 60 g 3   triamcinolone cream (KENALOG) 0.1 % Apply 1 application topically 2 (two) times daily. (Patient  taking differently: Apply 1 application  topically as needed.) 30 g 0   No current facility-administered medications for this visit.    Medication Side Effects: None  Allergies:  Allergies  Allergen Reactions   Aripiprazole     Other Reaction(s): akathisias, weight gain on 5 mg daily (43 lbs over 6 yrs.Only 6 lbs and no akathisias over 2 years.   Brexpiprazole     Other Reaction(s): itchy ankles, not effective on 1.5 mg qd   Lamotrigine     Other Reaction(s): no SE, not effective   Lithium     Other Reaction(s): no SE, not effective   Lurasidone     Other Reaction(s): increased appetite, not as "laid back:   Quetiapine     Other Reaction(s): sleepy, increased weight, not effective   Sulfa Antibiotics Hives and Itching   Ziprasidone     Other Reaction(s): drowsiness on 40 mg twice daily   Codeine Itching and Rash   Sulfamethoxazole Rash   Sulfamethoxazole-Trimethoprim Rash    Past Medical History:  Diagnosis Date   Bipolar 1 disorder (HCC)    Hyperlipidemia    Hypertension     Family History  Problem Relation Age of Onset   Stroke Mother    Rheumatic fever Mother    Heart Problems Mother    High blood pressure Father    High Cholesterol Father    Diabetes Father    Alzheimer's disease Father    Multiple myeloma Father     Social History   Socioeconomic History   Marital status: Married    Spouse name: Not on file   Number of children: Not on file   Years of education: Not on file   Highest education level: Not on file  Occupational History   Not on file  Tobacco Use   Smoking status: Former    Types: Cigarettes   Smokeless tobacco: Never  Vaping Use   Vaping status: Never Used  Substance and Sexual Activity   Alcohol use: Not Currently    Alcohol/week: 42.0 standard drinks of alcohol    Types: 42 Cans of beer per week   Drug use: Not Currently    Types: Marijuana   Sexual activity: Yes  Other Topics Concern   Not on file  Social History  Narrative   Right handed   Caffeine use: 1 cup coffee per day   Social Drivers of Health   Financial Resource Strain: Not on file  Food Insecurity: Not on file  Transportation Needs: Not on file  Physical Activity: Not on file  Stress: Not on file (04/18/2023)  Social Connections: Not on file  Intimate Partner Violence: Low Risk  (12/29/2019)   Received [facility]   Intimate Partner Violence    Insults You: Not on file    Threatens You: Not on file    Screams at [place]: Not on file    Physically Hurt: Not on file    Intimate Partner  Violence Score: Not on file    Past Medical History, Surgical history, Social history, and Family history were reviewed and updated as appropriate.   Please see review of systems for further details on the patient's review from today.   Objective:   Physical Exam:  There were no vitals taken for this visit.  Physical Exam Constitutional:      General: He is not in acute distress. Musculoskeletal:        General: No deformity.  Neurological:     Mental Status: He is alert and oriented to person, place, and time.     Coordination: Coordination normal.  Psychiatric:        Attention and Perception: Attention and perception normal. He does not perceive auditory or visual hallucinations.        Mood and Affect: Mood normal. Mood is not anxious or depressed. Affect is not labile, blunt, angry or inappropriate.        Speech: Speech normal.        Behavior: Behavior normal.        Thought Content: Thought content normal. Thought content is not paranoid or delusional. Thought content does not include homicidal or suicidal ideation. Thought content does not include homicidal or suicidal plan.        Cognition and Memory: Cognition and memory normal.        Judgment: Judgment normal.     Comments: Insight intact     Lab Review:     Component Value Date/Time   NA 140 03/16/2023 2131   K 3.7 03/16/2023 2131   CL 104  03/16/2023 2131   CO2 29 03/16/2023 2131   GLUCOSE 144 (H) 03/16/2023 2131   BUN 19 03/16/2023 2131   CREATININE 0.94 03/16/2023 2131   CALCIUM 9.2 03/16/2023 2131   PROT 6.5 03/16/2023 2131   ALBUMIN 4.2 03/16/2023 2131   AST 18 03/16/2023 2131   ALT 21 03/16/2023 2131   ALKPHOS 78 03/16/2023 2131   BILITOT 0.5 03/16/2023 2131   GFRNONAA >60 03/16/2023 2131   GFRAA >90 03/20/2011 1955       Component Value Date/Time   WBC 8.3 03/16/2023 2131   RBC 5.07 03/16/2023 2131   HGB 15.8 03/16/2023 2131   HCT 44.2 03/16/2023 2131   PLT 187 03/16/2023 2131   MCV 87.2 03/16/2023 2131   MCH 31.2 03/16/2023 2131   MCHC 35.7 03/16/2023 2131   RDW 13.4 03/16/2023 2131   LYMPHSABS 2.2 03/16/2023 2131   MONOABS 0.6 03/16/2023 2131   EOSABS 0.2 03/16/2023 2131   BASOSABS 0.0 03/16/2023 2131    No results found for: "POCLITH", "LITHIUM"   No results found for: "PHENYTOIN", "PHENOBARB", "VALPROATE", "CBMZ"   .res Assessment: Plan:    Plan:  PDMP reviewed  Working with therapist - Rockne Menghini  Add Trazadone 50mg  at [facility] as needed for sleep - one tablet at hs  Lamictal 100mg  at hs and 100mg  in the morning.   RTC 4 weeks  25 minutes spent dedicated to the care of this patient on the date of this encounter to include pre-visit review of records, ordering of medication, post visit documentation, and face-to-face time with the patient discussing BPD-1, anxiety and insomnia. Discussed continuing current medication regimen.  Patient advised to contact office with any questions, adverse effects, or acute worsening in signs and symptoms.  Counseled patient regarding potential benefits, risks, and side effects of Lamictal to include potential risk of Stevens-Johnson syndrome. Advised patient to stop taking Lamictal  and contact office immediately if rash develops and to seek urgent medical attention if rash is severe and/or spreading quickly.   Diagnoses and all orders for this  visit:  Bipolar I disorder (HCC) -     lamoTRIgine (LAMICTAL) 100 MG tablet; Take 1 tablet (100 mg total) by mouth 2 (two) times daily.  Insomnia, unspecified type -     traZODone (DESYREL) 50 MG tablet; Take 1 tablet (50 mg total) by mouth at bedtime.  Generalized anxiety disorder   Please see After Visit Summary for patient specific instructions.  Future Appointments  Date Time Provider Department Center  09/26/2023  5:00 PM Kelleen Patee, LCSW [facility] None  10/10/2023  5:00 PM Kelleen Patee, LCSW [facility] None  10/12/2023  4:30 PM [facility] 1 [facility] [address] [facility]  10/25/2023  5:00 PM Kelleen Patee, LCSW [facility] None  11/08/2023  5:00 PM Kelleen Patee, LCSW [facility] None  11/19/2023  5:00 PM Kelleen Patee, LCSW [facility] None  02/21/2024  4:15 PM Dellar Fenton, DO [facility] None    No orders of the defined types were placed in this encounter.     -------------------------------

## 2023-09-26 ENCOUNTER — Ambulatory Visit: Payer: BC Managed Care – PPO | Admitting: Psychiatry

## 2023-09-26 DIAGNOSIS — F319 Bipolar disorder, unspecified: Secondary | ICD-10-CM

## 2023-09-26 NOTE — Progress Notes (Signed)
 [facility] Counselor/Therapist Progress Note  Patient ID: Andrew Glover, MRN: [id number],    Date: 09/26/2023  Time Spent: 55 minutes   Treatment Type: Individual Therapy  Reported Symptoms: "bipolar", anxiety, "continued decrease in irritability"    Mental Status Exam:  Appearance:   Casual     Behavior:  Appropriate, Sharing, and Motivated  Motor:  Normal  Speech/Language:   Clear and Coherent  Affect:  anxious  Mood:  anxious  Thought process:  goal directed  Thought content:    WNL  Sensory/Perceptual disturbances:    WNL  Orientation:  oriented to person, place, time/date, situation, day of week, month of year, year, and stated date of September 26, 2023  Attention:  Good  Concentration:  Good  Memory:  WNL  Fund of knowledge:   Good  Insight:    Good and Fair  Judgment:   Good  Impulse Control:  Good   Risk Assessment: Danger to Self:  No Self-injurious Behavior: No Danger to Others: No Duty to Warn:no Physical Aggression / Violence:No  Access to Firearms a concern: No  Gang Involvement:No   Subjective:    Patient today working in session further on his anxiety, stress, anger, irritability, frustrations, and communication challenges within the family and marital relationships.  Acknowledges that his bipolar is a factor but feels that he has better control that he used to in terms of his behavior. States "I'm not having racing thoughts any more."  "Am learning to Think before I act." States relationship with wife is some better, but still needing work. Continues today from last session in talking through more of his years of growing up, getting married, and significant family issues. Shares that the family issues have more recently been calming down some. Home life continues to currently be better re: patient and wife's communication "but not perfect".  Accentuating the positives with patient some today especially towards end of session and he pointed out some of the  progress that he feels he has made since he first came for therapy and also on getting some help through one of our med providers for appropriate medication that has been helpful for patient.  Able to focus more on some positives.  Less irritability and less volatility in his anger.  Some improved communication at home with wife, however this is definitely a work in progress.  Encouraged patient and he is continuing to work on healthier communication and especially listening to each other and not interrupting each other as much.  Trying to think before speaking.  To practice more "active listening" as discussed in sessions.  Feels that he is demonstrating healthier limits with other people.  Loves his old dog and feels that he is therapeutic for patient also.  To continue to work on his goal-directed behaviors as he senses progress as well.  Interventions: Cognitive Behavioral Therapy and Ego-Supportive Reduce overall level, frequency, and intensity of the anxiety so that daily functioning is not impaired. Increase understanding of beliefs and messages that produce the worry and anxiety. Identify and use specific coping strategies for anxiety reduction.   Diagnosis:   ICD-10-CM   1. Bipolar I disorder (HCC)  F31.9      Plan:  Patient actively participating and showing good motivation in session today working further on his treatment goals specifically regarding his anxiety, paying more attention to how he says what he says, worries, and challenges in family interactions.  Good effort in session today and facial  expressions show some of the pride that he is feeling as he works on certain changes. Reminded and encouraged patient in his practice of more positive and self affirming behaviors as discussed in sessions including: Stay in contact with supportive people, remain in the present and focused on what he can change, name and emphasize his strengths as well as his need areas, pay attention to his anger  and manage it more effectively, reduce overthinking and over analyzing, letting go of things from the past that can hold him back now, practice improved listening skills to really hear what the other person is saying before he responds practice more patients in situations and relationships, and realize the strength he shows working with goal-directed behaviors to move in a direction that supports his overall improving emotional health and wellbeing.  Naren Benally continues to make progress and he needs to continue his work with goal-directed behaviors to keep moving in a healthier and more positive direction into the future.  Goal review and progress/challenges noted with patient.  Next appointment within 3 weeks.   Kelleen Patee, LCSW

## 2023-09-27 DIAGNOSIS — G4733 Obstructive sleep apnea (adult) (pediatric): Secondary | ICD-10-CM | POA: Diagnosis not present

## 2023-09-28 ENCOUNTER — Encounter (HOSPITAL_BASED_OUTPATIENT_CLINIC_OR_DEPARTMENT_OTHER): Payer: Self-pay | Admitting: Emergency Medicine

## 2023-09-28 ENCOUNTER — Other Ambulatory Visit: Payer: Self-pay

## 2023-09-28 ENCOUNTER — Emergency Department (HOSPITAL_BASED_OUTPATIENT_CLINIC_OR_DEPARTMENT_OTHER)

## 2023-09-28 ENCOUNTER — Emergency Department (HOSPITAL_BASED_OUTPATIENT_CLINIC_OR_DEPARTMENT_OTHER)
Admission: EM | Admit: 2023-09-28 | Discharge: 2023-09-28 | Disposition: A | Discharge status: Home / Self Care | Attending: Emergency Medicine | Admitting: Emergency Medicine

## 2023-09-28 DIAGNOSIS — S92354A Nondisplaced fracture of fifth metatarsal bone, right foot, initial encounter for closed fracture: Secondary | ICD-10-CM | POA: Diagnosis not present

## 2023-09-28 DIAGNOSIS — X58XXXA Exposure to other specified factors, initial encounter: Secondary | ICD-10-CM | POA: Insufficient documentation

## 2023-09-28 DIAGNOSIS — S99921A Unspecified injury of right foot, initial encounter: Secondary | ICD-10-CM | POA: Diagnosis not present

## 2023-09-28 DIAGNOSIS — E119 Type 2 diabetes mellitus without complications: Secondary | ICD-10-CM | POA: Diagnosis not present

## 2023-09-28 DIAGNOSIS — I10 Essential (primary) hypertension: Secondary | ICD-10-CM | POA: Diagnosis not present

## 2023-09-28 DIAGNOSIS — M79671 Pain in right foot: Secondary | ICD-10-CM | POA: Diagnosis not present

## 2023-09-28 DIAGNOSIS — Z7982 Long term (current) use of aspirin: Secondary | ICD-10-CM | POA: Diagnosis not present

## 2023-09-28 DIAGNOSIS — M899 Disorder of bone, unspecified: Secondary | ICD-10-CM | POA: Diagnosis not present

## 2023-09-28 DIAGNOSIS — Z79899 Other long term (current) drug therapy: Secondary | ICD-10-CM | POA: Diagnosis not present

## 2023-09-28 DIAGNOSIS — M7989 Other specified soft tissue disorders: Secondary | ICD-10-CM | POA: Insufficient documentation

## 2023-09-28 DIAGNOSIS — M19071 Primary osteoarthritis, right ankle and foot: Secondary | ICD-10-CM | POA: Diagnosis not present

## 2023-09-28 DIAGNOSIS — Z7984 Long term (current) use of oral hypoglycemic drugs: Secondary | ICD-10-CM | POA: Insufficient documentation

## 2023-09-28 HISTORY — DX: Gout, unspecified: M10.9

## 2023-09-28 NOTE — ED Provider Notes (Signed)
 [facility] Provider Note   CSN: [id number] Arrival date & time: 09/28/23  1115     History  Chief Complaint  Patient presents with   Foot Pain    Andrew Glover is a 63 y.o. male.  Patient with history of hypertension, hyperlipidemia, gout, diabetes presents today with complaints of right foot pain.  He states that same began yesterday and has been persistent since then.  He was seen yesterday in urgent care and was diagnosed with gout and given a prescription for Augmentin and indomethacin.  He has been taking this and notes the indomethacin has given him some improvement but his pain is not completely resolved.  States he is having significant difficulty walking due to pain in his foot.  Pain is on the outside of his foot.  Denies any trauma.  No fevers or chills.  Does note that he has a history of gout, however he states this is only occurred once before was in his hand.  The pain is sharp and does not feel like a burning sensation.  Denies any numbness or tingling.  Does note that he works all day on his feet.  Does note that his blood sugars are normally very well-controlled.  Denies any calf pain.   The history is provided by the patient. No language interpreter was used.  Foot Pain       Home Medications Prior to Admission medications   Medication Sig Start Date End Date Taking? Authorizing Provider  ascorbic acid (VITAMIN C) 1000 MG tablet Take 1,000 mg by mouth daily.    [provider]  aspirin 81 MG EC tablet Take 1 tablet by mouth every morning.    [provider]  atenolol -chlorthalidone  (TENORETIC ) 50-25 MG tablet Take 1 tablet by mouth daily. 09/20/21   Berneta Elsie Sayre, MD  atorvastatin  (LIPITOR) 40 MG tablet Take 1 tablet (40 mg total) by mouth daily. Take 40 mg by mouth daily. 09/20/21   Berneta Elsie Sayre, MD  cetirizine (ZYRTEC) 10 MG tablet Take 10 mg by mouth daily.    [provider]  clobetasol cream (TEMOVATE) 0.05 % Apply 1 Application topically as needed.    [provider]  ergocalciferol (VITAMIN D2) 1.25 MG (50000 UT) capsule Take by mouth.    [provider]  lamoTRIgine  (LAMICTAL ) 100 MG tablet Take 1 tablet (100 mg total) by mouth 2 (two) times daily. 09/25/23   Mozingo, Regina Nattalie, NP  losartan  (COZAAR ) 100 MG tablet Take 1 tablet (100 mg total) by mouth daily. 09/20/21   Berneta Elsie Sayre, MD  metFORMIN (GLUCOPHAGE-XR) 500 MG 24 hr tablet Take 500 mg by mouth 2 (two) times daily. 03/29/23   [provider]  methylPREDNISolone  (MEDROL ) 4 MG tablet Taper from 6 pills po for one day to 1 pill po the last day over 6 days 05/14/23   Sater, Charlie LABOR, MD  Multiple Vitamin (MULTIVITAMIN PO) Take 1 tablet by mouth daily.    [provider]  Roflumilast  (ZORYVE ) 0.3 % CREA Apply 1 Application topically daily. 08/14/23   Alm Delon SAILOR, DO  traZODone  (DESYREL ) 50 MG tablet Take 1 tablet (50 mg total) by mouth at bedtime. 09/25/23   Mozingo, Regina Nattalie, NP  triamcinolone  cream (KENALOG ) 0.1 % Apply 1 application topically 2 (two) times daily. Patient taking differently: Apply 1 application  topically as needed. 12/16/20   Berneta Elsie Sayre, MD      Allergies  Aripiprazole , Brexpiprazole, Lamotrigine , Lithium, Lurasidone, Quetiapine, Sulfa antibiotics, Ziprasidone , Codeine, Sulfamethoxazole, and Sulfamethoxazole-trimethoprim    Review of Systems   Review of Systems  Musculoskeletal:  Positive for arthralgias.  All other systems reviewed and are negative.   Physical Exam Updated Vital Signs BP 121/63 (BP Location: Left Arm)   Pulse (!) 59   Temp 97.9 F (36.6 C) (Oral)   Resp 16   Ht 5' 7 (1.702 m)   Wt 88.9 kg   SpO2 100%   BMI 30.70 kg/m  Physical Exam Vitals and nursing note reviewed.  Constitutional:      General: He is not in acute distress.    Appearance: Normal appearance. He is  normal weight. He is not ill-appearing, toxic-appearing or diaphoretic.  HENT:     Head: Normocephalic and atraumatic.  Cardiovascular:     Rate and Rhythm: Normal rate.  Pulmonary:     Effort: Pulmonary effort is normal. No respiratory distress.  Musculoskeletal:        General: Normal range of motion.     Cervical back: Normal range of motion.     Comments: Swelling noted throughout palpation of the right foot with mild warmth, no erythema.  No bruising.  No point tenderness.  ROM intact to the ankle with no discomfort.  Able to wiggle his toes.  Good capillary refill and sensation in his foot.  DP and PT pulses intact and 2+.  No calf tenderness.  Negative Thompson's test  Skin:    General: Skin is warm and dry.  Neurological:     General: No focal deficit present.     Mental Status: He is alert.  Psychiatric:        Mood and Affect: Mood normal.        Behavior: Behavior normal.     ED Results / Procedures / Treatments   Labs (all labs ordered are listed, but only abnormal results are displayed) Labs Reviewed - No data to display  EKG None  Radiology DG Foot Complete Right Result Date: 09/28/2023 CLINICAL DATA:  Foot pain EXAM: RIGHT FOOT COMPLETE - 3+ VIEW COMPARISON:  None Available. FINDINGS: No malalignment. Suspicion of acute nondisplaced fracture at the base of the fifth metatarsal. 15 mm circumscribed lucent lesion proximal aspect of fifth metatarsal. No malalignment. Mild degenerative change at the first MTP joint. IMPRESSION: 1. Suspicion of acute nondisplaced fracture at the base of the fifth metatarsal. 2. 15 mm circumscribed lucent lesion proximal aspect of fifth metatarsal, indeterminate in appearance. Further evaluation with nonemergent MRI is recommended. Electronically Signed   By: Luke Bun M.D.   On: 09/28/2023 15:28    Procedures Procedures    Medications Ordered in ED Medications - No data to display  ED Course/ Medical Decision Making/ A&P                                  Medical Decision Making Amount and/or Complexity of Data Reviewed Radiology: ordered.   Patient presents today with complaints of atraumatic right foot pain x 1 day.  He is afebrile, nontoxic-appearing, and in no acute distress reassuring vital signs.  Physical exam reveals TTP noted throughout the right foot with swelling and warmth without erythema.  No fluctuance or induration.  ROM intact to the ankle without issue.  Good distal pulses and sensation.  No calf tenderness.  Doubt DVT.  Does not appear cellulitic in nature.  No wounds.  Also does not appear consistent with gout, therefore will obtain an x-ray.  X-ray of patient's foot has resulted and reveals  1. Suspicion of acute nondisplaced fracture at the base of the fifth metatarsal. 2. 15 mm circumscribed lucent lesion proximal aspect of fifth metatarsal, indeterminate in appearance. Further evaluation with nonemergent MRI is recommended  I have personally reviewed and interpreted this imaging and agree with radiology interpretation.  Patient's pain is predominantly along the lateral base of the fifth metatarsal consistent with patient's fracture location.  I have informed him of the lesion in his fifth metatarsal as well as recommendations for MRI.  Will give referral to orthopedics for follow-up and to facilitate this.  Patient given a postop shoe and crutches for management of this fracture. Does not appear to be a Jones fracture morphology. Evaluation and diagnostic testing in the emergency department does not suggest an emergent condition requiring admission or immediate intervention beyond what has been performed at this time.  Plan for discharge with close PCP follow-up.  Patient is understanding and amenable with plan, educated on red flag symptoms that would prompt immediate return.  Patient discharged in stable condition.  Final Clinical Impression(s) / ED Diagnoses Final diagnoses:  Nondisplaced  fracture of fifth metatarsal bone, right foot, initial encounter for closed fracture  Bone lesion    Rx / DC Orders ED Discharge Orders     None     An After Visit Summary was printed and given to the patient.     Andrew Glover 09/28/23 1606    Tegeler, Lonni PARAS, MD 10/01/23 [phone]

## 2023-09-28 NOTE — ED Triage Notes (Addendum)
 Rt foot pain since yesterday has hx of gout and has beenntaking his meds but it is still swollen hurts to walk started on indocin and augmintine 2 days ago

## 2023-09-28 NOTE — ED Notes (Signed)
 Discharge paperwork reviewed entirely with patient, including follow up care. Pain was under control. No prescriptions were called in, but all questions were addressed.  Pt verbalized understanding as well as all parties involved. No questions or concerns

## 2023-09-28 NOTE — Discharge Instructions (Signed)
 As we discussed, x-ray of your foot showed signs of a fracture on the outside of your foot at the area that is painful. We have given you a shoe and crutches to wear for management of this. You also need to see orthopedics for follow-up management of this injury.  In the interim, I recommend that you rest and elevate your foot is much as possible.  Your x-ray also showed a lesion in that bone at the base of your little toe.  You need to have an MRI outpatient to look this better.  This can be done through orthopedics as well.  Please take Tylenol  and ibuprofen for pain.  Return if development of any new or worsening symptoms.

## 2023-09-28 NOTE — ED Notes (Signed)
 Pt requested to take Indomethacin medication for gout. Spoke with PA and she advised that pt is ok to take medication.

## 2023-10-04 DIAGNOSIS — R2241 Localized swelling, mass and lump, right lower limb: Secondary | ICD-10-CM | POA: Diagnosis not present

## 2023-10-10 ENCOUNTER — Ambulatory Visit (INDEPENDENT_AMBULATORY_CARE_PROVIDER_SITE_OTHER): Payer: BC Managed Care – PPO | Admitting: Psychiatry

## 2023-10-10 DIAGNOSIS — F319 Bipolar disorder, unspecified: Secondary | ICD-10-CM | POA: Diagnosis not present

## 2023-10-10 NOTE — Progress Notes (Signed)
 [facility] Counselor/Therapist Progress Note  Patient ID: Andrew Glover, MRN: [id number],    Date: 10/10/2023  Time Spent: 60 minutes   Treatment Type: Individual Therapy  Reported Symptoms:  "bipolar", anxiety, continues to decrease in irritability   Mental Status Exam:  Appearance:   Casual     Behavior:  Appropriate, Sharing, and Motivated  Motor:  Normal  Speech/Language:   Clear and Coherent  Affect:  Congruent  Mood:  anxious  Thought process:  goal directed  Thought content:    WNL  Sensory/Perceptual disturbances:    WNL  Orientation:  oriented to person, place, time/date, situation, day of week, month of year, year, and stated date of October 10, 2023  Attention:  Good  Concentration:  Good  Memory:  WNL  Fund of knowledge:   Good  Insight:    Good and Fair  Judgment:   Good and Fair  Impulse Control:  Good   Risk Assessment: Danger to Self:  No Self-injurious Behavior: No Danger to Others: No Duty to Warn:no Physical Aggression / Violence:No  Access to Firearms a concern: No  Gang Involvement:No   Subjective:   Patient in session today concentrating more on his anxiety, anger, irritability, stress, frustrations, and communication challenges mostly within his marital and family relationships.  Adds that his "bipolar" he knows is a factor but he feels that he has better control "of my behavior" than he used to have.  Denies "racing thoughts", reporting that he is better at thinking before he acts that he used to be.  As a result, he reports and it seems that his relationship with wife and family is some better as he continues to work on this area of his life. Work going ok. Had recent bout with gout in his foot but much better now. Reports communication with wife has been some better more recently including listening some better, being more mutually respectful in their communication "sometimes". Talking further today re: family issues relating to conflict and  communication within marital relationship, and also related to some of his history within his family of origin. (Not all details included in this note due to patient privacy needs.) Some decrease in irritability. Trying to see and accentuate things that go well versus go badly. Continues work on better communication with wife. Working on communication including not talking over others and not jumping to negative conclusions. Continues working on his goals and especially within personal/family relationships.Review of goals and progress, in addition to challenges for patient.  Interventions: Cognitive Behavioral Therapy, Solution-Oriented/Positive Psychology, and Ego-Supportive  Reduce overall level, frequency, and intensity of the anxiety so that daily functioning is not impaired. Increase understanding of beliefs and messages that produce the worry and anxiety. Identify and use specific coping strategies for anxiety reduction.   Diagnosis:   ICD-10-CM   1. Bipolar I disorder (HCC)  F31.9      Plan:  Patient today continues showing good motivation as he works further in sessions on his treatment goals particularly paying attention to how he says what he says, his worries, his anxiety, and various challenges in his family interactions with multiple family members.  Showing really good effort today and staying more goal-focused. Encouraged patient to be practicing more positive and self affirming behaviors as discussed in sessions including: Remain in contact with supportive people, remain in the present and focused on what he can change, name and emphasize his strengths as well as his need areas, pay attention  to his anger and manage it more effectively, reduce overthinking and over analyzing, noticed his positives, letting go of things from the past that can hold him back now, practice improved listening skills to really hear with the other person is saying before he responds, practice more patience in  situation and relationships, and recognize the strength he shows working with goal-directed behaviors to move in a direction that supports his overall improving emotional health and wellbeing. [name] continues to make progress and needs to keep working with goal-directed behaviors to move in a healthier and more positive direction going forward.  Goal review and progress/challenges noted with patient.  Next appointment within 3 weeks.   Kelleen Patee, LCSW

## 2023-10-12 ENCOUNTER — Ambulatory Visit
Admission: RE | Admit: 2023-10-12 | Discharge: 2023-10-12 | Disposition: A | Discharge status: Home / Self Care | Source: Ambulatory Visit | Attending: Family Medicine | Admitting: Family Medicine

## 2023-10-12 DIAGNOSIS — Z122 Encounter for screening for malignant neoplasm of respiratory organs: Secondary | ICD-10-CM | POA: Diagnosis not present

## 2023-10-12 DIAGNOSIS — Z87891 Personal history of nicotine dependence: Secondary | ICD-10-CM | POA: Diagnosis not present

## 2023-10-25 ENCOUNTER — Ambulatory Visit: Payer: BC Managed Care – PPO | Admitting: Psychiatry

## 2023-10-27 DIAGNOSIS — G4733 Obstructive sleep apnea (adult) (pediatric): Secondary | ICD-10-CM | POA: Diagnosis not present

## 2023-10-27 DIAGNOSIS — M79671 Pain in right foot: Secondary | ICD-10-CM | POA: Diagnosis not present

## 2023-11-08 ENCOUNTER — Ambulatory Visit: Payer: BC Managed Care – PPO | Admitting: Psychiatry

## 2023-11-08 DIAGNOSIS — F319 Bipolar disorder, unspecified: Secondary | ICD-10-CM | POA: Diagnosis not present

## 2023-11-08 NOTE — Progress Notes (Signed)
 [facility] Counselor/Therapist Progress Note  Patient ID: Andrew Glover, MRN: [id number],    Date: 11/08/2023  Time Spent: 58 minutes   Treatment Type: Individual Therapy  Reported Symptoms: anxiety, "bipolar", irritability    Mental Status Exam:  Appearance:   Casual     Behavior:  Appropriate, Sharing, and Motivated  Motor:  Normal  Speech/Language:   Clear and Coherent  Affect:  Depressed and anxious, some irritability  Mood:  anxious and bipolar reported  Thought process:  goal directed  Thought content:    WNL  Sensory/Perceptual disturbances:    WNL  Orientation:  oriented to person, place, time/date, situation, day of week, month of year, year, and stated date of Nov 08, 2023  Attention:  Good  Concentration:  Good  Memory:  WNL  Fund of knowledge:   Good  Insight:    Good and Fair  Judgment:   Good  Impulse Control:  Good   Risk Assessment: Danger to Self:  No Self-injurious Behavior: No Danger to Others: No Duty to Warn:no Physical Aggression / Violence:No  Access to Firearms a concern: No  Gang Involvement:No   Subjective:  Patient focusing more on his anxiety, "bipolar", and irritability. Reported less friction more recently at home with spouse. States "wife has been some nicer." Shares and talks through multiple stressors within family (some within marriage), and is noting some decrease in arguments and anger. Feels he is more in control of his anger and does admit that since he has quit drinking, he has had more success in controlling his anger. Working today further on challenges within his family and his marriage. Discussed communication issues between him and wife which he reports flares up occasionally and hard to refrain from being louder and that doesn't help their communication. Easily angered at times but adds that he never is harmful to anyone physically and is always in control of his anger. Foot has healed from the gout. Some decrease in anger  and relationship with wife is changing some as he compliments her and says "thank you" more often. Focusing more on his positives and the blessings in life versus just negatives.  Courage to continue his work on his verbal communication particularly at home with wife.  Pointed out ways in which he is improving and working on his goals.  Interventions: Cognitive Behavioral Therapy and Ego-Supportive Reduce overall level, frequency, and intensity of the anxiety so that daily functioning is not impaired. Increase understanding of beliefs and messages that produce the worry and anxiety. Identify and use specific coping strategies for anxiety reduction.    Diagnosis:   ICD-10-CM   1. Bipolar I disorder (HCC)  F31.9      Plan:   Patient in for session today with good motivation as he focuses further on his treatment goals especially related to his anxiety, paying attention to how he says what he says in relationships, his worries, and various challenges within his family interactions and multiple family members.  Admits that he does have some slip ups that times but then tries to get back on track with his goals and behavior changes needed. Good effort and encouraged to keep this up going forward.  Reminded and encouraged patient to be practicing more positive and self affirming behaviors as discussed in sessions including: Remain in contact with supportive people, stay in the present and focused on what he can change, name and emphasize his strengths as well as his need areas, pay attention  to his anger and manage it more effectively, reduce overthinking and over analyzing, notices positives, letting go of things from the past that can hold him back now, practice improved listening skills to really hear what the other person is saying before he responds, practicing more patients in situations and relationships, and realizing the strength he shows working with goal-directed behaviors to move in a direction  that supports his overall improving emotional health and his outlook into the future. Jw Covin continues to make progress and needs to keep working with his goal-directed behaviors to help move himself in a healthier and more positive direction going forward.  Goal review and progress/challenges noted with patient.  Next appointment within 3 weeks.   Kelleen Patee, LCSW

## 2023-11-15 DIAGNOSIS — S92354A Nondisplaced fracture of fifth metatarsal bone, right foot, initial encounter for closed fracture: Secondary | ICD-10-CM | POA: Diagnosis not present

## 2023-11-15 DIAGNOSIS — R2241 Localized swelling, mass and lump, right lower limb: Secondary | ICD-10-CM | POA: Diagnosis not present

## 2023-11-19 ENCOUNTER — Ambulatory Visit: Admitting: Psychiatry

## 2023-11-22 DIAGNOSIS — H401131 Primary open-angle glaucoma, bilateral, mild stage: Secondary | ICD-10-CM | POA: Diagnosis not present

## 2023-11-27 ENCOUNTER — Ambulatory Visit: Admitting: Psychiatry

## 2023-11-27 DIAGNOSIS — F319 Bipolar disorder, unspecified: Secondary | ICD-10-CM | POA: Diagnosis not present

## 2023-11-27 DIAGNOSIS — G4733 Obstructive sleep apnea (adult) (pediatric): Secondary | ICD-10-CM | POA: Diagnosis not present

## 2023-11-27 NOTE — Progress Notes (Signed)
 [facility] Counselor/Therapist Progress Note  Patient ID: BERNADETTE ARMIJO, MRN: [id number],    Date: 11/27/2023  Time Spent: 55 minutes   Treatment Type: Individual Therapy  Reported Symptoms: anxiety, bipolar, some depression   Mental Status Exam:  Appearance:   Casual     Behavior:  Appropriate, Sharing, and Motivated  Motor:  Normal  Speech/Language:   Clear and Coherent  Affect:  Anxious, depressed  Mood:  anxious and depressed  Thought process:  goal directed  Thought content:    Rumination  Sensory/Perceptual disturbances:    WNL  Orientation:  oriented to person, place, time/date, situation, day of week, month of year, year, and stated date of November 27, 2023  Attention:  Good  Concentration:  Good and Fair  Memory:  WNL  Fund of knowledge:   Good  Insight:    Good and Fair  Judgment:   Good  Impulse Control:  Good and Fair   Risk Assessment: Danger to Self:  No Self-injurious Behavior: No Danger to Others: No Duty to Warn:no Physical Aggression / Violence:No  Access to Firearms a concern: No  Gang Involvement:No   Subjective:   Patient today in session and working further on his anxiety, bipolar, and some depression. Was noticing some improvement at home in marital relationship but also now gets very frustrated with wife. Tried marital therapy with a therapist at another practice but we quit because it wasn't helping and didn't agree with therapist. Multiple stressors in his family and wife's adult kids are difficult for patient due to their inappropriate talk, and nasty talk, and have no respect. Some conflict does arise as patient and wife do not agree about allowing the kids to act inappropriate and rude. Worked more with patient today on his anxiety and trying to reduce the intensity, communication issues especially in being able to talk about problems in their marriage without blaming each other and instead be able to work together on the issues that  seem to separate them. Shares that he is wanting to work on my foul language, which for patient means he needs to be more intentional and in control of what he says rather than just impulsively blurting out . Working on some relaxation and choosing not to use the foul language and think before I speak. Has not fully rebounded from his gout issue with his foot and to see Dr again end of this month. Talked about the difficulty in staying focused on his goals, but feels he is making progress and it helps me to vent things I can't tell others, and venting helps me a lot. Is trying to give wife more compliments and show more care for her. Wants to be more positive and improve communication with her, per strategies shared today in session.  Interventions: Cognitive Behavioral Therapy and Ego-Supportive Reduce overall level, frequency, and intensity of the anxiety so that daily functioning is not impaired. Increase understanding of beliefs and messages that produce the worry and anxiety. Identify and use specific coping strategies for anxiety reduction.    Diagnosis:   ICD-10-CM   1. Bipolar I disorder (HCC)  F31.9      Plan:   Patient today in session working further on treatment goals related specifically to his anxiety, his worries, various challenges within his family interactions and paying attention to how he says what he says in relationships.  Is difficult for patient to stay with topics that are challenging him but did do a  little better with that today and I encouraged him to notice even the small bits of progress upon which he can build and make more progress going forward.  Encouraged patient to practice more positive and self affirming behaviors as discussed in sessions including: Stay in the present and focused on what he can change, pay attention to his anger and manage it more effectively, stay in contact with supportive people, intentionally work to have more patience with others and  relationships, letting go of things from the past that can hold him back now, practice improved listening skills to really hear with the other person is saying before he responds, and recognize the strength he shows working with goal-directed behaviors to help move forward in a healthier and more positive direction.  Goal review and progress/challenges noted with patient.  Next appointment within 3 weeks.   Kelleen Patee, LCSW

## 2023-11-29 DIAGNOSIS — Z125 Encounter for screening for malignant neoplasm of prostate: Secondary | ICD-10-CM | POA: Diagnosis not present

## 2023-11-29 DIAGNOSIS — E1169 Type 2 diabetes mellitus with other specified complication: Secondary | ICD-10-CM | POA: Diagnosis not present

## 2023-11-29 DIAGNOSIS — M109 Gout, unspecified: Secondary | ICD-10-CM | POA: Diagnosis not present

## 2023-11-29 DIAGNOSIS — I10 Essential (primary) hypertension: Secondary | ICD-10-CM | POA: Diagnosis not present

## 2023-11-29 DIAGNOSIS — E782 Mixed hyperlipidemia: Secondary | ICD-10-CM | POA: Diagnosis not present

## 2023-11-29 DIAGNOSIS — Z0000 Encounter for general adult medical examination without abnormal findings: Secondary | ICD-10-CM | POA: Diagnosis not present

## 2023-11-30 DIAGNOSIS — G4733 Obstructive sleep apnea (adult) (pediatric): Secondary | ICD-10-CM | POA: Diagnosis not present

## 2023-12-06 ENCOUNTER — Ambulatory Visit: Admitting: Psychiatry

## 2023-12-10 ENCOUNTER — Ambulatory Visit: Admitting: Psychiatry

## 2023-12-10 DIAGNOSIS — L989 Disorder of the skin and subcutaneous tissue, unspecified: Secondary | ICD-10-CM | POA: Diagnosis not present

## 2023-12-10 DIAGNOSIS — M85679 Other cyst of bone, unspecified ankle and foot: Secondary | ICD-10-CM | POA: Diagnosis not present

## 2023-12-11 ENCOUNTER — Ambulatory Visit: Admitting: Psychiatry

## 2023-12-11 DIAGNOSIS — F319 Bipolar disorder, unspecified: Secondary | ICD-10-CM | POA: Diagnosis not present

## 2023-12-11 NOTE — Progress Notes (Signed)
 [facility] Counselor/Therapist Progress Note  Patient ID: Andrew Glover, MRN: [id number],    Date: 12/11/2023  Time Spent: 53 minutes   Treatment Type: Individual Therapy  Reported Symptoms: bipolar, anxiety, some depression but some better     Mental Status Exam:  Appearance:   Casual     Behavior:  Appropriate, Sharing, and Motivated  Motor:  Normal  Speech/Language:   Clear and Coherent  Affect:  Depressed and some irritability  Mood:  anxious, depressed, and irritable  Thought process:  goal directed  Thought content:    Rumination  Sensory/Perceptual disturbances:    WNL  Orientation:  oriented to person, place, time/date, situation, day of week, month of year, year, and stated date of December 11, 2023  Attention:  Good  Concentration:  Good and Poor  Memory:  WNL  Fund of knowledge:   Good  Insight:    Good and Fair  Judgment:   Good  Impulse Control:  Good   Risk Assessment: Danger to Self:  No Self-injurious Behavior: No Danger to Others: No Duty to Warn:no Physical Aggression / Violence:No  Access to Firearms a concern: No  Gang Involvement:No   Subjective: Patient working well in session today focusing more on his anxiety, some depression, and bipolar.  Reporting more ups and downs at times in communication with his wife and extended family. Still refraining from drinking alcohol. Family issues and differences continue and are very aggravating for patient. Sharing several situations that have fed' his anger, and does feel he is working on being more patient and not responding angrily in some situations. Also struggling with control issues when certain things are clearly out of his control. Venting frustrating family circumstances and talking through several situations, looking at how he can manage them more productively and calmly. In sessions, using part of time in helping patient be better at modulating his tone when upset within his family members.  Showing some progress in behavioral changes on which he is working both within the family and work. Has had more problematic issues in the communication with wife on recent occasions which he did work on some in session today.  Interventions: Cognitive Behavioral Therapy, Solution-Oriented/Positive Psychology, and Ego-Supportive Reduce overall level, frequency, and intensity of the anxiety so that daily functioning is not impaired. Increase understanding of beliefs and messages that produce the worry and anxiety. Identify and use specific coping strategies for anxiety reduction.   Diagnosis:   ICD-10-CM   1. Bipolar I disorder (HCC)  F31.9      Plan:   Patient working well today in session on treatment goals specifically related to his anxiety, worries, challenges within his family interactions, and paying attention to how he says what he says in relationships.  It is noticeable that patient does frequently avoid topics and goals that challenge him and I have continued to encourage him even in small bits of progress on which he can build upon and create more progress going forward.  He has some history of difficulty making changes that are positive but does seem to be showing some increased effort as well as increased understanding of other people involved.  Still working on having  more patience especially with people that see things differently from him, whether it be people within his family, extended family, and others. States he knows he is making progress and shared several examples today including not responding to angry people, minding my own business, and feels  I am  accomplishing something when we talk in sessions. Reminded and encouraged patient to be practicing more positive and self affirming behaviors as discussed in sessions including: Remain in the present and focus on what he can change, pay attention to his anger and manage it more effectively as discussed in sessions, remain in  contact with supportive people, intentionally work to have more patience with others and relationships, letting go of things from the past that can hold him back now, practice improved listening skills to really hear what the other person is saying before he responds, and recognize the strength he shows working with goal-directed behaviors to move forward in a healthier and more positive direction.  Goal review and progress/challenges noted with patient.  Next appointment within 3 weeks.   Barnie Bunde, LCSW

## 2023-12-20 ENCOUNTER — Ambulatory Visit: Admitting: Psychiatry

## 2023-12-20 DIAGNOSIS — F319 Bipolar disorder, unspecified: Secondary | ICD-10-CM

## 2023-12-20 NOTE — Progress Notes (Signed)
 [facility] Counselor/Therapist Progress Note  Patient ID: Andrew Glover, MRN: [id number],    Date: 12/20/2023  Time Spent: 55 minutes   Treatment Type: Individual Therapy  Reported Symptoms: anxiety, bipolar, depression some better    Mental Status Exam:  Appearance:   Casual and Neat     Behavior:  Appropriate, Sharing, and Motivated  Motor:  Normal  Speech/Language:   Clear and Coherent  Affect:  Anxious, depression  Mood:  anxious and depressed  Thought process:  goal directed  Thought content:    Rumination  Sensory/Perceptual disturbances:    WNL  Orientation:  oriented to person, place, time/date, situation, day of week, month of year, year, and stated date of December 20, 2023  Attention:  Good  Concentration:  Good  Memory:  WNL  Fund of knowledge:   Good  Insight:    Good and Fair  Judgment:   Good/Fair  Impulse Control:  Good and Fair   Risk Assessment: Danger to Self:  No Self-injurious Behavior: No Danger to Others: No Duty to Warn:no Physical Aggression / Violence:No  Access to Firearms a concern: No  Gang Involvement:No   Subjective:  Patient continuing to show good motivation and work in session on his anxiety, bipolar, and some depression. Does feel that he is doing better by setting healthier limits, no alcohol consumption. Working today further on family-related frustrations, and patient venting and sharing his concerns about how over time his marriage/family has become more dyfunctional and feels he may need to separate at least for a short while.  States he would never harm anyone in his family including wife. Shared and worked on some communication issues and talking in ways that others may hear more of what he actually says. Foot issue has worsened some and will eventually likely need surgery. Trying to remain calmer at home and reports he has been successful with this at times and at other times it is more difficult.  Talked further with him  about what might help him to be more successful more of the time and his answer was that he may need to separate for a while as he had mentioned earlier in session.  Encouraged patient in some of his interests including walking in his neighborhood, spending time with his dog, and being in contact with some of his friends.  Some control issues when things are out of his control and that is hard for him to except.  Discussed more communication skills especially within the family particularly with his wife.   Interventions: Cognitive Behavioral Therapy, Solution-Oriented/Positive Psychology, and Ego-Supportive Reduce overall level, frequency, and intensity of the anxiety so that daily functioning is not impaired. Increase understanding of beliefs and messages that produce the worry and anxiety. Identify and use specific coping strategies for anxiety reduction.    Diagnosis:   ICD-10-CM   1. Bipolar I disorder (HCC)  F31.9      Plan:   Patient today in session continuing his work on his treatment goals related to challenges within his family interactions, his anxiety, worries, and paying attention to how he says what he says in relationships.  Trying to notice and encouraged patient in even small progress or successes.  Is needing to increase his efforts in some of the changes he is working towards and this was shared with him today in session.  He still  feels he is accomplishing things when we talk in sessions, and I think some of that may  be his having less impulsivity and being able to not react to everything that leads him being angry or irritable. Reminded and encouraged patient to be practicing more positive and self affirming behaviors as discussed in sessions including: Staying in the present and focusing on what he can change, pay attention to his anger and manage it more effectively as discussed in sessions, remain in contact with supportive people, intentionally work to have more patience  with others and in relationships, letting go of things from the past that can hold him back now, practice improved listening skills to really hear what the other person is saying before he responds, and realize the strength he shows working with goal-directed behaviors to move forward in a more positive and healthier direction into the future.  Goal review and progress/challenges noted with patient.  Next appointment within 3 weeks.   Barnie Bunde, LCSW

## 2023-12-27 ENCOUNTER — Encounter: Payer: Self-pay | Admitting: Adult Health

## 2023-12-27 ENCOUNTER — Ambulatory Visit: Admitting: Adult Health

## 2023-12-27 DIAGNOSIS — F411 Generalized anxiety disorder: Secondary | ICD-10-CM

## 2023-12-27 DIAGNOSIS — F319 Bipolar disorder, unspecified: Secondary | ICD-10-CM

## 2023-12-27 DIAGNOSIS — G4700 Insomnia, unspecified: Secondary | ICD-10-CM

## 2023-12-27 DIAGNOSIS — G4733 Obstructive sleep apnea (adult) (pediatric): Secondary | ICD-10-CM | POA: Diagnosis not present

## 2023-12-27 NOTE — Progress Notes (Addendum)
 [name] [id number] 06-30-1960 63 y.o.  Subjective:   Patient ID:  Andrew Glover is a 63 y.o. (DOB Oct 04, 1960) male.  Chief Complaint: No chief complaint on file.   HPI Andrew Glover presents to the office today for follow-up of BPD-1, GAD, and insomnia.  Describes mood today as ok. Pleasant. Denies tearfulness. Mood symptoms - denies depression, anxiety and irritability. Reports stable interest and motivation. Denies panic attacks. Reports some over thinking. Denies obsessive thoughts and acts. Reports mood is stable. Stating I feel like I'm doing ok. Taking medications as prescribed.  Energy levels stable. Active, does not have a regular exercise routine.  Enjoys some usual interests and activities. Married. Lives with wife. Has 3 step children. Spending time with family and friends. Appetite adequate. Weight 203 pounds. Sleeps well most nights. Averages 6 hours. Focus and concentration stable. Completing tasks. Managing aspects of household. Works at [employer]. Denies SI or HI.  Denies AH or VH. Denies self harm. Denies substance use.  Previous medication trials: Abilify , Lamictal , Latuda, Lithium, Rexulti, Trileptal, Geodon    GAD-7    Flowsheet Row Office Visit from 09/20/2021 in [facility] Visit from 12/16/2020 in [facility]  Total GAD-7 Score 1 10   PHQ2-9    Flowsheet Row Office Visit from 09/20/2021 in [facility] Visit from 03/18/2021 in [facility] Visit from 12/16/2020 in [facility]  PHQ-2 Total Score 0 0 0  PHQ-9 Total Score 0 -- 10   Flowsheet Row ED from 09/28/2023 in [facility] ED from 03/16/2023 in [facility] at [facility]  C-SSRS RISK CATEGORY No Risk No Risk      Review of Systems:  Review of Systems  Musculoskeletal:  Negative for gait problem.  Neurological:  Negative for tremors.  Psychiatric/Behavioral:         Please refer to HPI    Medications: I have reviewed the patient's current medications.  Current Outpatient Medications  Medication Sig Dispense Refill   ascorbic acid (VITAMIN C) 1000 MG tablet Take 1,000 mg by mouth daily.     aspirin 81 MG EC tablet Take 1 tablet by mouth every morning.     atenolol -chlorthalidone  (TENORETIC ) 50-25 MG tablet Take 1 tablet by mouth daily. 30 tablet 1   atorvastatin  (LIPITOR) 40 MG tablet Take 1 tablet (40 mg total) by mouth daily. Take 40 mg by mouth daily. 30 tablet 1   cetirizine (ZYRTEC) 10 MG tablet Take 10 mg by mouth daily.     clobetasol cream (TEMOVATE) 0.05 % Apply 1 Application topically as needed.     ergocalciferol (VITAMIN D2) 1.25 MG (50000 UT) capsule Take by mouth.     lamoTRIgine  (LAMICTAL ) 100 MG tablet Take 1 tablet (100 mg total) by mouth 2 (two) times daily. 180 tablet 1   losartan  (COZAAR ) 100 MG tablet Take 1 tablet (100 mg total) by mouth daily. 30 tablet 1   metFORMIN (GLUCOPHAGE-XR) 500 MG 24 hr tablet Take 500 mg by mouth 2 (two) times daily.     methylPREDNISolone  (MEDROL ) 4 MG tablet Taper from 6 pills po for one day to 1 pill po the last day over 6 days 21 tablet 0   Multiple Vitamin (MULTIVITAMIN PO) Take 1 tablet by mouth daily.     Roflumilast  (ZORYVE ) 0.3 %  CREA Apply 1 Application topically daily. 60 g 3   traZODone  (DESYREL ) 50 MG tablet Take 1 tablet (50 mg total) by mouth at bedtime. 30 tablet 2   triamcinolone  cream (KENALOG ) 0.1 % Apply 1 application topically 2 (two) times daily. (Patient taking differently: Apply 1 application  topically as needed.) 30 g 0   No current facility-administered medications for this visit.    Medication Side Effects: None  Allergies:  Allergies  Allergen Reactions   Aripiprazole      Other Reaction(s): akathisias, weight  gain on 5 mg daily (43 lbs over 6 yrs.Only 6 lbs and no akathisias over 2 years.   Brexpiprazole     Other Reaction(s): itchy ankles, not effective on 1.5 mg qd   Lamotrigine      Other Reaction(s): no SE, not effective   Lithium     Other Reaction(s): no SE, not effective   Lurasidone     Other Reaction(s): increased appetite, not as laid back:   Quetiapine     Other Reaction(s): sleepy, increased weight, not effective   Sulfa Antibiotics Hives and Itching   Ziprasidone      Other Reaction(s): drowsiness on 40 mg twice daily   Codeine Itching and Rash   Sulfamethoxazole Rash   Sulfamethoxazole-Trimethoprim Rash    Past Medical History:  Diagnosis Date   Bipolar 1 disorder (HCC)    Gout    Hyperlipidemia    Hypertension     Past Medical History, Surgical history, Social history, and Family history were reviewed and updated as appropriate.   Please see review of systems for further details on the patient's review from today.   Objective:   Physical Exam:  There were no vitals taken for this visit.  Physical Exam Constitutional:      General: He is not in acute distress. Musculoskeletal:        General: No deformity.  Neurological:     Mental Status: He is alert and oriented to person, place, and time.     Coordination: Coordination normal.  Psychiatric:        Attention and Perception: Attention and perception normal. He does not perceive auditory or visual hallucinations.        Mood and Affect: Mood normal. Mood is not anxious or depressed. Affect is not labile, blunt, angry or inappropriate.        Speech: Speech normal.        Behavior: Behavior normal.        Thought Content: Thought content normal. Thought content is not paranoid or delusional. Thought content does not include homicidal or suicidal ideation. Thought content does not include homicidal or suicidal plan.        Cognition and Memory: Cognition and memory normal.        Judgment: Judgment normal.      Comments: Insight intact     Lab Review:     Component Value Date/Time   NA 140 03/16/2023 2131   K 3.7 03/16/2023 2131   CL 104 03/16/2023 2131   CO2 29 03/16/2023 2131   GLUCOSE 144 (H) 03/16/2023 2131   BUN 19 03/16/2023 2131   CREATININE 0.94 03/16/2023 2131   CALCIUM  9.2 03/16/2023 2131   PROT 6.5 03/16/2023 2131   ALBUMIN 4.2 03/16/2023 2131   AST 18 03/16/2023 2131   ALT 21 03/16/2023 2131   ALKPHOS 78 03/16/2023 2131   BILITOT 0.5 03/16/2023 2131   GFRNONAA >60 03/16/2023 2131   GFRAA >90 03/20/2011 1955  Component Value Date/Time   WBC 8.3 03/16/2023 2131   RBC 5.07 03/16/2023 2131   HGB 15.8 03/16/2023 2131   HCT 44.2 03/16/2023 2131   PLT 187 03/16/2023 2131   MCV 87.2 03/16/2023 2131   MCH 31.2 03/16/2023 2131   MCHC 35.7 03/16/2023 2131   RDW 13.4 03/16/2023 2131   LYMPHSABS 2.2 03/16/2023 2131   MONOABS 0.6 03/16/2023 2131   EOSABS 0.2 03/16/2023 2131   BASOSABS 0.0 03/16/2023 2131    No results found for: POCLITH, LITHIUM   No results found for: PHENYTOIN, PHENOBARB, VALPROATE, CBMZ   .res Assessment: Plan:    Plan:  PDMP reviewed  Working with therapist - Marval Bunde  Trazadone 50mg  at HS as needed for sleep - one to tablets at hs  Lamictal  100mg  at hs and 100mg  in the morning.   RTC 4 weeks  25 minutes spent dedicated to the care of this patient on the date of this encounter to include pre-visit review of records, ordering of medication, post visit documentation, and face-to-face time with the patient discussing BPD-1, anxiety and insomnia. Discussed continuing current medication regimen.  Patient advised to contact office with any questions, adverse effects, or acute worsening in signs and symptoms.  Counseled patient regarding potential benefits, risks, and side effects of Lamictal  to include potential risk of Stevens-Johnson syndrome. Advised patient to stop taking Lamictal  and contact office immediately if rash  develops and to seek urgent medical attention if rash is severe and/or spreading quickly.   Diagnoses and all orders for this visit:  Bipolar I disorder (HCC)  Insomnia, unspecified type  Generalized anxiety disorder     Please see After Visit Summary for patient specific instructions.  Future Appointments  Date Time Provider Department Center  01/22/2024  5:00 PM Bunde Sober, LCSW [facility] None  02/04/2024  5:00 PM Bunde Sober, LCSW [facility] None  02/21/2024  4:15 PM Alm Delon SAILOR, DO [facility] None  02/28/2024  4:00 PM Christyne Mccain, Angeline Mattocks, NP [facility] None  02/28/2024  5:00 PM Bunde Sober, LCSW [facility] None  03/18/2024  5:00 PM Bunde Sober, LCSW [facility] None  04/01/2024  5:00 PM Bunde Sober, LCSW [facility] None  04/16/2024  5:00 PM Bunde Sober, LCSW [facility] None    No orders of the defined types were placed in this encounter.   -------------------------------

## 2024-01-03 ENCOUNTER — Ambulatory Visit: Admitting: Psychiatry

## 2024-01-03 DIAGNOSIS — F319 Bipolar disorder, unspecified: Secondary | ICD-10-CM

## 2024-01-03 NOTE — Progress Notes (Signed)
 [facility] Counselor/Therapist Progress Note  Patient ID: Andrew Glover, MRN: [id number],    Date: 01/03/2024  Time Spent: 58 minutes   Treatment Type: Individual Therapy  Reported Symptoms:  anxiety, bipolar, depression some improved    Mental Status Exam:  Appearance:   Casual     Behavior:  Sharing, Motivated, and some agitation  Motor:  Normal  Speech/Language:   Clear and Coherent  Affect:  Depressed and anxious  Mood:  anxious and depressed  Thought process:  goal directed  Thought content:    Obsessions and Rumination  Sensory/Perceptual disturbances:    WNL  Orientation:  oriented to person, place, time/date, situation, day of week, month of year, year, and stated date of January 03, 2024  Attention:  Good  Concentration:  Good  Memory:  WNL  Fund of knowledge:   Good  Insight:    Good and Fair  Judgment:   Good and Fair  Impulse Control:  Good   Risk Assessment: Danger to Self:  No Self-injurious Behavior: No Danger to Others: No Duty to Warn:no Physical Aggression / Violence:No  Access to Firearms a concern: No  Gang Involvement:No   Subjective:    Patient in for appointment today showing good motivation and continued work on his bipolar, anxiety, marital and family issues, and some depression.  Patient and wife previously were in marital therapy at a different office but stopped and refused to return as they were having difficulty being committed to those sessions. Patient today having difficulty with some verbal communication as discussed in session today. Work going ok but having injured foot has been an issue; may need surgery eventually. Issues with a neighbor and trying to resolve things peacefully and with some letting go.  Family issues continues, and patient better able to talk through challenges but also very frustrated. Refraining from any alcohol consumption. Continues trying to set healthier boundaries. Trying to manage family frustrations  better. Reports improved management of stress, anxiety and anger which he continues to work on especially within family.  Interventions: Cognitive Behavioral Therapy, Solution-Oriented/Positive Psychology, and Ego-Supportive Reduce overall level, frequency, and intensity of the anxiety so that daily functioning is not impaired. Increase understanding of beliefs and messages that produce the worry and anxiety. Identify and use specific coping strategies for anxiety reduction.     Diagnosis:   ICD-10-CM   1. Bipolar I disorder (HCC)  F31.9      Plan:  Patient working in session today specifically on his treatment goals related to his anxiety, worries, paying attention to how he says what he says in relationships, and challenges within his family interactions, and better managing situations that anger or frustrate him.  Also working on not jumping to conclusions that are inaccurate.  Patient did state that he feels overall he is doing some better and realized that an earlier situation today had upset him but after talking through it at length, he is calmer and feeling better and able to focus on other situations.  Less impulsivity and becoming more able to control more of his reactions which is really positive for him. Encouraged patient to be practicing more positive and self affirming behaviors as discussed in sessions including: Remain in the present and focused on what he can change, pay attention to his anger and manage it more effectively as discussed in sessions, remain in contact with supportive people, intentionally work to have more patience with others and in relationships, letting go of things  from the past that can hold him back now, practice improved listening skills to really hear what the other person is saying before he responds, and recognize the strength he shows working with goal-directed behaviors to move in a more positive and healthier direction going forward.  Goal review and  progress/challenges noted with patient.  Next appointment within 3 weeks.   Barnie Bunde, LCSW

## 2024-01-14 ENCOUNTER — Ambulatory Visit: Admitting: Psychiatry

## 2024-01-21 ENCOUNTER — Ambulatory Visit: Admitting: Psychiatry

## 2024-01-22 ENCOUNTER — Ambulatory Visit: Admitting: Psychiatry

## 2024-01-22 DIAGNOSIS — F319 Bipolar disorder, unspecified: Secondary | ICD-10-CM | POA: Diagnosis not present

## 2024-01-22 NOTE — Progress Notes (Signed)
 [facility] Counselor/Therapist Progress Note  Patient ID: Andrew Glover, MRN: [id number],    Date: 01/22/2024  Time Spent: 55 minutes   Treatment Type: Individual Therapy  Reported Symptoms: anxiety, bipolar, depression improving    Mental Status Exam:  Appearance:   Casual     Behavior:  Appropriate and Sharing  Motor:  Normal  Speech/Language:   Clear and Coherent  Affect:  anxious  Mood:  anxious and some depression  Thought process:  goal directed  Thought content:    WNL  Sensory/Perceptual disturbances:    WNL  Orientation:  oriented to person, place, time/date, situation, day of week, month of year, year, and stated date of Aug. 12, 2025  Attention:  Good  Concentration:  Good  Memory:  States his pills made him too sleepy and he stopped taking them and will notify his med provider at his appt  Fund of knowledge:   Good  Insight:    Good and Fair  Judgment:   Good  Impulse Control:  Good and Fair   Risk Assessment: Danger to Self:  No Self-injurious Behavior: No Danger to Others: No Duty to Warn:no Physical Aggression / Violence:No  Access to Firearms a concern: No  Gang Involvement:No   Subjective:  Patient today in appointment reporting anxiety, bipolar, marital and family issues, some anger, and some depression. Communication issues with wife and states he is pointing out his concerns to her but not sure it's helping. Questioned whether he was talking to her or with her, and then discussed the difference with patience. Issues with sister who lives across from him, and sharing/processing his frustrations and some anger related to family circumstances. Does not indicate any danger at all but does feel it is helpful for him vent his feelings and frustrations. Dysfunction at home and does process this more in session today, including frustrations with others within the family. (Not all details included in this note per patient privacy needs.) To  continue working on behavioral responses, listening, being able to let go and not over-react, and accepting what he cannot control, and move forward. Encouraged healthy boundaries, working to better manage family frustrations and stressors.    Interventions: Cognitive Behavioral Therapy, Solution-Oriented/Positive Psychology, and Ego-Supportive Reduce overall level, frequency, and intensity of the anxiety so that daily functioning is not impaired. Increase understanding of beliefs and messages that produce the worry and anxiety. Identify and use specific coping strategies for anxiety reduction.    Diagnosis:   ICD-10-CM   1. Bipolar I disorder (HCC)  F31.9      Plan:    Patient today focusing further on his anxiety, paying attention to how he says what he says in relationships, worries, and challenges within his family interactions, while better managing situations that anger or frustrate him.  Patient does feel he has made some progress including not being as quick to jump to conclusions that might be inaccurate. Reminded and encouraged patient to practice more positive and self-affirming behaviors as discussed in sessions including: Remain in the present and focusing on what he can change, pay attention to his anger and try managing it more effectively, remain in contact with supportive people, intentionally work to have more patience with others and in relationships, letting go of things from the past that can hold him back now, practicing improved listening skills to really hear what the other person is saying before he responds, and recognize the strength he shows working with goal-directed behaviors to  move in a more positive and healthier direction going forward.  Goal review and progress/challenges noted with patient.  Next appt within 3 weeks.   Barnie Bunde, LCSW

## 2024-01-25 DIAGNOSIS — R442 Other hallucinations: Secondary | ICD-10-CM | POA: Diagnosis not present

## 2024-01-27 DIAGNOSIS — G4733 Obstructive sleep apnea (adult) (pediatric): Secondary | ICD-10-CM | POA: Diagnosis not present

## 2024-01-29 ENCOUNTER — Ambulatory Visit: Admitting: Psychiatry

## 2024-02-04 ENCOUNTER — Ambulatory Visit: Admitting: Psychiatry

## 2024-02-04 DIAGNOSIS — F319 Bipolar disorder, unspecified: Secondary | ICD-10-CM | POA: Diagnosis not present

## 2024-02-04 NOTE — Progress Notes (Signed)
 [facility] Counselor/Therapist Progress Note  Patient ID: Andrew Glover, MRN: [id number],    Date: 02/04/2024  Time Spent: 55 minutes   Treatment Type: Individual Therapy  Reported Symptoms:  anxiety, bipolar, depression improved some    Mental Status Exam:  Appearance:   Casual     Behavior:  Appropriate, Sharing, and Motivated  Motor:  Normal  Speech/Language:   Normal Rate  Affect:  Some depression, anxiety  Mood:  anxious  Thought process:  normal  Thought content:    WNL  Sensory/Perceptual disturbances:    WNL  Orientation:  oriented to person, place, time/date, situation, day of week, month of year, year, and stated date of Aug. 25, 2025  Attention:  Good  Concentration:  Good  Memory:  WNL  Fund of knowledge:   Good  Insight:    Good and Fair  Judgment:   Good  Impulse Control:  Good   Risk Assessment: Danger to Self:  No Self-injurious Behavior: No Danger to Others: No Duty to Warn:no Physical Aggression / Violence:No  Access to Firearms a concern: No  Gang Involvement:No   Subjective:   Patient in appt today and reports symptoms of  bipolar but not extreme, anxiety, some anger, marital and family issues, and depression. Continued communication issues with wife and some of extended family. Trying to set clear boundaries with other family members. Encouraged more work on his anxiety, family stressors, his communication within family, and not jumping to negative conclusions.  Resentment built up with wife's daughter and patient but talking with her so far has not helped.  Patient shared that he is giving wife's daughter another chance to talk and hopefully that will happen soon. Venting more of his concerns today about his marriage, the differences between patient and wife. Ran out of time and will pick up on this with patient again next session.  Some dysfunction at home continues and patient is encouraged to maintain healthy boundaries and to be committed  to better manage his frustrations and stressors.   Interventions: Cognitive Behavioral Therapy, Solution-Oriented/Positive Psychology, and Ego-Supportive Reduce overall level, frequency, and intensity of the anxiety so that daily functioning is not impaired. Increase understanding of beliefs and messages that produce the worry and anxiety. Identify and use specific coping strategies for anxiety reduction.     Diagnosis:   ICD-10-CM   1. Bipolar I disorder (HCC)  F31.9      Plan:  Patient in session today working further on paying closer attention to how he says what he says in relationships, his worrying, focusing further on his anxiety, and the challenges he faces with in family interactions while also trying to better manage his own anger and frustration in healthier ways.  Feels that he has made some progress but admits to his continued need to work on issues particularly regarding family relationships.  Seems to do well on his job.  Encouraged him to continue practicing more positive and self affirming behaviors as discussed in sessions including: Staying in the present and focusing on what he can change versus cannot change, pay attention to his anger and try managing it more effectively, stay in contact with supportive people, intentionally work to have more patience with others and in relationships, letting go of things from the past that hold him back now, practicing improved listening skills to really hear what other people are saying to him before he responds, and realize the strength he shows working with goal-directed behaviors to  move in a more positive and healthier direction.  Goal review and progress/challenges noted with patient.  Next appt within 3 weeks.   Barnie Bunde, LCSW

## 2024-02-07 ENCOUNTER — Ambulatory Visit: Admitting: Dermatology

## 2024-02-14 ENCOUNTER — Ambulatory Visit: Admitting: Psychiatry

## 2024-02-21 ENCOUNTER — Ambulatory Visit: Admitting: Dermatology

## 2024-02-21 ENCOUNTER — Encounter: Payer: Self-pay | Admitting: Dermatology

## 2024-02-21 VITALS — BP 128/70

## 2024-02-21 DIAGNOSIS — R438 Other disturbances of smell and taste: Secondary | ICD-10-CM

## 2024-02-21 DIAGNOSIS — L304 Erythema intertrigo: Secondary | ICD-10-CM | POA: Diagnosis not present

## 2024-02-21 DIAGNOSIS — L750 Bromhidrosis: Secondary | ICD-10-CM

## 2024-02-21 DIAGNOSIS — B356 Tinea cruris: Secondary | ICD-10-CM

## 2024-02-21 MED ORDER — TACROLIMUS 0.1 % EX OINT: TOPICAL_OINTMENT | Freq: Two times a day (BID) | CUTANEOUS | 2 refills | Status: DC

## 2024-02-21 NOTE — Progress Notes (Signed)
   Follow-Up Visit   Subjective  Andrew Glover is a 63 y.o. male established patient who presents for FOLLOW UP on the diagnoses listed below:  Patient was last evaluated on 08/14/23.   Bromhidrosis: Recommended continuing washing areas with dial antibacterial and using epsom salt baths and gold bond powder. Patient reports sxs are better. He has sought a second opinion with PCP/ENT and was told that he is having post-COVID Sx with changes in his smell that is causing him to think he is smelling something that is not there.  The following portions of the chart were reviewed this encounter and updated as appropriate: medications, allergies, medical history  Review of Systems:  No other skin or systemic complaints except as noted in HPI or Assessment and Plan.  Objective  Well appearing patient in no apparent distress; mood and affect are within normal limits.   A focused examination was performed of the following areas: groin   Relevant exam findings are noted in the Assessment and Plan.    Assessment & Plan    1. Intertrigo - Assessment: Patient has been using various treatments for skin irritation, including Dial soap, Epsom Salt baths, and Gold Bond powder. Previous use of clobetasol was discontinued due to risk of skin thinning with prolonged use. Zoryve  was tried but became ineffective. Tacrolimus  was prescribed but not well-tolerated by the patient. Erythrasma was considered as a possible diagnosis, but Wood's lamp examination was negative for coral red fluorescence, ruling out this condition. - Plan:    Apply a thin layer of tacrolimus  mixed with Ciclopate (Avene product containing zinc and copper)    Continue weekly Epsom Salt baths    Use Women's Dove Unscented Deodorant for sweat control    If needed, will send prescription for tacrolimus     Will attempt to obtain Opzelura sample for potential future use    Try solid formulation first (less preservative, lower chance of  burning)    Contact if condition worsens before follow-up  2. Altered Olfactory Perception - Assessment: Patient reports persistent smell that others do not notice, possibly related to COVID-19 affecting sense of smell. This has resulted in anxiety, particularly in social situations. - Plan:    Recommend discussing olfactory changes with family doctor  Follow-up in 8 weeks to reassess dermatitis treatment response. TINEA CRURIS   Related Medications tacrolimus  (PROTOPIC ) 0.1 % ointment Apply topically 2 (two) times daily. Apply to groin twice daily  Return in about 8 weeks (around 04/17/2024) for INTERTRIGO.   Documentation: I have reviewed the above documentation for accuracy and completeness, and I agree with the above.  I, Shirron Maranda, CMA, am acting as scribe for [employer], DO.   Delon Lenis, DO

## 2024-02-21 NOTE — Patient Instructions (Addendum)
 Date: Thu Feb 21 2024  Hello Andrew Glover,  Thank you for visiting today. Here is a summary of the key instructions:  - Medications:   - Mix tacrolimus  with Ciclopate and apply a thin layer to affected areas   - Use Women's Dove Original Deodorant to control sweating  - Treatment Areas:   - Continue weekly Epsom Salt baths  - Follow-up:   - Return for follow-up appointment in 8 weeks   - Contact the office if your condition worsens before the follow-up appointment  Please reach out if you have any questions or concerns.  Warm regards,  Dr. Delon Lenis [facility]       Important Information  Due to recent changes in healthcare laws, you may see results of your pathology and/or laboratory studies on MyChart before the doctors have had a chance to review them. We understand that in some cases there may be results that are confusing or concerning to you. Please understand that not all results are received at the same time and often the doctors may need to interpret multiple results in order to provide you with the best plan of care or course of treatment. Therefore, we ask that you please give us  2 business days to thoroughly review all your results before contacting the office for clarification. Should we see a critical lab result, you will be contacted sooner.   If You Need Anything After Your Visit  If you have any questions or concerns for your doctor, please call our main line at [phone] If no one answers, please leave a voicemail as directed and we will return your call as soon as possible. Messages left after 4 pm will be answered the following business day.   You may also send us  a message via MyChart. We typically respond to MyChart messages within 1-2 business days.  For prescription refills, please ask your pharmacy to contact our office. Our fax number is [phone].  If you have an urgent issue when the clinic is closed that cannot wait until the next  business day, you can page your doctor at the number below.    Please note that while we do our best to be available for urgent issues outside of office hours, we are not available 24/7.   If you have an urgent issue and are unable to reach us , you may choose to seek medical care at your doctor's office, retail clinic, urgent care center, or emergency room.  If you have a medical emergency, please immediately call 911 or go to the emergency department. In the event of inclement weather, please call our main line at [phone] for an update on the status of any delays or closures.  Dermatology Medication Tips: Please keep the boxes that topical medications come in in order to help keep track of the instructions about where and how to use these. Pharmacies typically print the medication instructions only on the boxes and not directly on the medication tubes.   If your medication is too expensive, please contact our office at [phone] or send us  a message through MyChart.   We are unable to tell what your co-pay for medications will be in advance as this is different depending on your insurance coverage. However, we may be able to find a substitute medication at lower cost or fill out paperwork to get insurance to cover a needed medication.   If a prior authorization is required to get your medication covered by your insurance company, please allow  us  1-2 business days to complete this process.  Drug prices often vary depending on where the prescription is filled and some pharmacies may offer cheaper prices.  The website www.goodrx.com contains coupons for medications through different pharmacies. The prices here do not account for what the cost may be with help from insurance (it may be cheaper with your insurance), but the website can give you the price if you did not use any insurance.  - You can print the associated coupon and take it with your prescription to the pharmacy.  - You may  also stop by our office during regular business hours and pick up a GoodRx coupon card.  - If you need your prescription sent electronically to a different pharmacy, notify our office through [facility] or by phone at [phone]

## 2024-02-25 DIAGNOSIS — G4733 Obstructive sleep apnea (adult) (pediatric): Secondary | ICD-10-CM | POA: Diagnosis not present

## 2024-02-28 ENCOUNTER — Encounter: Payer: Self-pay | Admitting: Adult Health

## 2024-02-28 ENCOUNTER — Ambulatory Visit: Admitting: Adult Health

## 2024-02-28 ENCOUNTER — Ambulatory Visit: Admitting: Psychiatry

## 2024-02-28 DIAGNOSIS — G4700 Insomnia, unspecified: Secondary | ICD-10-CM

## 2024-02-28 DIAGNOSIS — F411 Generalized anxiety disorder: Secondary | ICD-10-CM | POA: Diagnosis not present

## 2024-02-28 DIAGNOSIS — F319 Bipolar disorder, unspecified: Secondary | ICD-10-CM | POA: Diagnosis not present

## 2024-02-28 NOTE — Progress Notes (Signed)
 [facility] Counselor/Therapist Progress Note  Patient ID: Andrew Glover, MRN: [id number],    Date: 02/28/2024  Time Spent: 53 minutes   Treatment Type: Individual Therapy  Reported Symptoms:     Anxiety, depression (improved), bipolar  Mental Status Exam:  Appearance:   Casual     Behavior:  Appropriate, Sharing, and Motivated  Motor:  Normal  Speech/Language:   Clear and Coherent  Affect:  anxious  Mood:  anxious  Thought process:  normal  Thought content:    WNL  Sensory/Perceptual disturbances:    WNL  Orientation:  oriented to person, place, time/date, situation, day of week, month of year, year, and stated date of Sept. 18, 2025  Attention:  Good  Concentration:  Good  Memory:  WNL  Fund of knowledge:   Good  Insight:    Good and Fair  Judgment:   Good  Impulse Control:  Good   Risk Assessment: Danger to Self:  No Self-injurious Behavior: No Danger to Others: No Duty to Warn:no Physical Aggression / Violence:No  Access to Firearms a concern: No  Gang Involvement:No   Subjective:    Patient today in session reporting  and working on some continued anxiety, anger although not as strong, marital and family issues not as bad currently, depression decreased, and bipolar but not very strong. Having some back issues. Wife is away this week out of town. Reports things are some better with wife as she treats me nicer. Some extended family issues going on that impact patient and he processed them in session today. Does admit that  wife has been some better past few days. States this is making him feel better. Encouraging him in paying more attention to how he says what he says especially within the family and patient agrees. Working on having and respecting clear boundaries. Continue to encourage patient in improving family communication and resist jumping to conclusions. Resentment issues calmer today. States he has had better week.   Interventions: Cognitive  Behavioral Therapy, Solution-Oriented/Positive Psychology, and Ego-Supportive Reduce overall level, frequency, and intensity of the anxiety so that daily functioning is not impaired. Increase understanding of beliefs and messages that produce the worry and anxiety. Identify and use specific coping strategies for anxiety reduction.   Diagnosis:   ICD-10-CM   1. Bipolar I disorder (HCC)  F31.9      Plan:  Patient working well in session today as he works on improving some of his relationships, his communication within those relationships, focusing further on his anxiety, the challenges he faces within the family interactions and trying to better manage his own anger and frustration in healthier ways.  Seems more recently that the communication within the family has been a little better.  Anxiety and low frustration tolerance level some better today.  Encouraged patient to continue paying good attention to how he says what he says and his relationships as this has a lot of impact on how others respond to him.  Reporting some decrease in anger more recently which probably has helped his communication within the family be better.  Patient reports he does very well on his job and does not have any issues with other employees.  Will continue as discussed today, on working on his communication and attitude with others in the family.  Encouraging him to keep practicing positive and self affirming behaviors as noted in sessions including: Stay in the present and focused on what he can change versus cannot change, pay  attention to his anger and try managing it more effectively, stay in contact with supportive people, intentionally work to have more patience with others and in relationships, letting go of things from the past that hold him back now, and practice using more improved listening skills to really hear what other people say to him before he responds, while realizing the strength he shows working with  goal-directed behaviors that help him move in a more positive and healthier direction.  Goal review and progress/challenges noted with patient.  Next appointment within 3 weeks.   Barnie Bunde, LCSW

## 2024-02-28 NOTE — Progress Notes (Signed)
 Andrew Glover [id number] 01-16-1961 63 y.o.  Subjective:   Patient ID:  Andrew Glover is a 63 y.o. (DOB 1960-06-29) male.  Chief Complaint: No chief complaint on file.   HPI Andrew Glover presents to the office today for follow-up of BPD-1, GAD, and insomnia.  Describes mood today as ok. Pleasant. Denies tearfulness. Mood symptoms - denies depression, anxiety and irritability. Reports stable interest and motivation. Denies panic attacks. Reports some over thinking. Denies obsessive thoughts and acts. Reports mood is stable. Stating I feel like I'm doing alright. Taking medications as prescribed.  Energy levels stable. Active, does not have a regular exercise routine.  Enjoys some usual interests and activities. Married. Lives with wife. Has 3 step children. Spending time with family and friends. Appetite adequate. Weight 203 pounds. Sleeps well most nights. Averages 6 hours. Focus and concentration stable. Completing tasks. Managing aspects of household. Works at [employer]. Denies SI or HI.  Denies AH or VH. Denies self harm. Denies substance use.  Previous medication trials: Abilify , Lamictal , Latuda, Lithium, Rexulti, Trileptal, Geodon    GAD-7    Flowsheet Row Office Visit from 09/20/2021 in [facility] Visit from 12/16/2020 in [facility]  Total GAD-7 Score 1 10   PHQ2-9    Flowsheet Row Office Visit from 09/20/2021 in [facility] Visit from 03/18/2021 in [facility] Visit from 12/16/2020 in [facility]  PHQ-2 Total Score 0 0 0  PHQ-9 Total Score 0 -- 10   Flowsheet Row ED from 09/28/2023 in [facility] at [facility] ED from 03/16/2023 in [facility] at [facility]  C-SSRS RISK CATEGORY No Risk No Risk      Review of Systems:  Review of Systems  Musculoskeletal:  Negative for gait problem.  Neurological:  Negative for tremors.  Psychiatric/Behavioral:         Please refer to HPI    Medications: I have reviewed the patient's current medications.  Current Outpatient Medications  Medication Sig Dispense Refill   ascorbic acid (VITAMIN C) 1000 MG tablet Take 1,000 mg by mouth daily.     aspirin 81 MG EC tablet Take 1 tablet by mouth every morning.     atenolol -chlorthalidone  (TENORETIC ) 50-25 MG tablet Take 1 tablet by mouth daily. 30 tablet 1   atorvastatin  (LIPITOR) 40 MG tablet Take 1 tablet (40 mg total) by mouth daily. Take 40 mg by mouth daily. 30 tablet 1   cetirizine (ZYRTEC) 10 MG tablet Take 10 mg by mouth daily.     clobetasol cream (TEMOVATE) 0.05 % Apply 1 Application topically as needed.     ergocalciferol (VITAMIN D2) 1.25 MG (50000 UT) capsule Take by mouth.     lamoTRIgine  (LAMICTAL ) 100 MG tablet Take 1 tablet (100 mg total) by mouth 2 (two) times daily. 180 tablet 1   losartan  (COZAAR ) 100 MG tablet Take 1 tablet (100 mg total) by mouth daily. 30 tablet 1   metFORMIN (GLUCOPHAGE-XR) 500 MG 24 hr tablet Take 500 mg by mouth 2 (two) times daily.     methylPREDNISolone  (MEDROL ) 4 MG tablet Taper from 6 pills po for one day to 1 pill po the last day over 6 days 21 tablet 0   Multiple Vitamin (MULTIVITAMIN PO) Take 1 tablet by mouth daily.     Roflumilast  (ZORYVE ) 0.3 %  CREA Apply 1 Application topically daily. 60 g 3   tacrolimus  (PROTOPIC ) 0.1 % ointment Apply topically 2 (two) times daily. Apply to groin twice daily 100 g 2   traZODone  (DESYREL ) 50 MG tablet Take 1 tablet (50 mg total) by mouth at bedtime. 30 tablet 2   triamcinolone  cream (KENALOG ) 0.1 % Apply 1 application topically 2 (two) times daily. (Patient taking differently: Apply 1 application  topically as needed.) 30 g 0   No current facility-administered medications for this visit.    Medication Side  Effects: None  Allergies:  Allergies  Allergen Reactions   Aripiprazole      Other Reaction(s): akathisias, weight gain on 5 mg daily (43 lbs over 6 yrs.Only 6 lbs and no akathisias over 2 years.   Brexpiprazole     Other Reaction(s): itchy ankles, not effective on 1.5 mg qd   Lamotrigine      Other Reaction(s): no SE, not effective   Lithium     Other Reaction(s): no SE, not effective   Lurasidone     Other Reaction(s): increased appetite, not as laid back:   Quetiapine     Other Reaction(s): sleepy, increased weight, not effective   Sulfa Antibiotics Hives and Itching   Ziprasidone      Other Reaction(s): drowsiness on 40 mg twice daily   Codeine Itching and Rash   Sulfamethoxazole Rash   Sulfamethoxazole-Trimethoprim Rash    Past Medical History:  Diagnosis Date   Bipolar 1 disorder (HCC)    Gout    Hyperlipidemia    Hypertension     Past Medical History, Surgical history, Social history, and Family history were reviewed and updated as appropriate.   Please see review of systems for further details on the patient's review from today.   Objective:   Physical Exam:  There were no vitals taken for this visit.  Physical Exam Constitutional:      General: He is not in acute distress. Musculoskeletal:        General: No deformity.  Neurological:     Mental Status: He is alert and oriented to person, place, and time.     Cranial Nerves: No dysarthria.     Coordination: Coordination normal.  Psychiatric:        Attention and Perception: Attention and perception normal. He does not perceive auditory or visual hallucinations.        Mood and Affect: Mood normal. Mood is not anxious or depressed. Affect is not labile, blunt, angry or inappropriate.        Speech: Speech normal.        Behavior: Behavior normal. Behavior is cooperative.        Thought Content: Thought content normal. Thought content is not paranoid or delusional. Thought content does not include homicidal  or suicidal ideation. Thought content does not include homicidal or suicidal plan.        Cognition and Memory: Cognition and memory normal.        Judgment: Judgment normal.     Comments: Insight intact     Lab Review:     Component Value Date/Time   NA 140 03/16/2023 2131   K 3.7 03/16/2023 2131   CL 104 03/16/2023 2131   CO2 29 03/16/2023 2131   GLUCOSE 144 (H) 03/16/2023 2131   BUN 19 03/16/2023 2131   CREATININE 0.94 03/16/2023 2131   CALCIUM  9.2 03/16/2023 2131   PROT 6.5 03/16/2023 2131   ALBUMIN 4.2 03/16/2023 2131   AST 18 03/16/2023 2131  ALT 21 03/16/2023 2131   ALKPHOS 78 03/16/2023 2131   BILITOT 0.5 03/16/2023 2131   GFRNONAA >60 03/16/2023 2131   GFRAA >90 03/20/2011 1955       Component Value Date/Time   WBC 8.3 03/16/2023 2131   RBC 5.07 03/16/2023 2131   HGB 15.8 03/16/2023 2131   HCT 44.2 03/16/2023 2131   PLT 187 03/16/2023 2131   MCV 87.2 03/16/2023 2131   MCH 31.2 03/16/2023 2131   MCHC 35.7 03/16/2023 2131   RDW 13.4 03/16/2023 2131   LYMPHSABS 2.2 03/16/2023 2131   MONOABS 0.6 03/16/2023 2131   EOSABS 0.2 03/16/2023 2131   BASOSABS 0.0 03/16/2023 2131    No results found for: POCLITH, LITHIUM   No results found for: PHENYTOIN, PHENOBARB, VALPROATE, CBMZ   .res Assessment: Plan:    Plan:  PDMP reviewed  Working with therapist - Marval Bunde  Trazadone 50mg  at HS as needed for sleep - one to tablets at hs  Lamictal  100mg  at hs and 100mg  in the morning.   RTC 4 weeks  25 minutes spent dedicated to the care of this patient on the date of this encounter to include pre-visit review of records, ordering of medication, post visit documentation, and face-to-face time with the patient discussing BPD-1, anxiety and insomnia. Discussed continuing current medication regimen.  Patient advised to contact office with any questions, adverse effects, or acute worsening in signs and symptoms.  Counseled patient regarding potential  benefits, risks, and side effects of Lamictal  to include potential risk of Stevens-Johnson syndrome. Advised patient to stop taking Lamictal  and contact office immediately if rash develops and to seek urgent medical attention if rash is severe and/or spreading quickly.   There are no diagnoses linked to this encounter.   Please see After Visit Summary for patient specific instructions.  Future Appointments  Date Time Provider Department Center  02/28/2024  5:00 PM Bunde Sober, LCSW [facility] None  03/18/2024  5:00 PM Bunde Sober, LCSW [facility] None  04/10/2024  4:00 PM Bunde Sober, LCSW [facility] None  04/16/2024  5:00 PM Bunde Sober, LCSW [facility] None  04/23/2024  3:15 PM Alm Delon SAILOR, DO [facility] None  05/19/2024  5:00 PM Bunde Sober, LCSW [facility] None    No orders of the defined types were placed in this encounter.   -------------------------------

## 2024-03-01 DIAGNOSIS — G4733 Obstructive sleep apnea (adult) (pediatric): Secondary | ICD-10-CM | POA: Diagnosis not present

## 2024-03-17 ENCOUNTER — Ambulatory Visit: Admitting: Psychiatry

## 2024-03-18 ENCOUNTER — Ambulatory Visit: Admitting: Psychiatry

## 2024-03-18 DIAGNOSIS — F411 Generalized anxiety disorder: Secondary | ICD-10-CM

## 2024-03-18 NOTE — Progress Notes (Signed)
 [facility] Counselor/Therapist Progress Note  Patient ID: Andrew Glover, MRN: [id number],    Date: 03/18/2024  Time Spent: 55 minutes   Treatment Type: Individual Therapy  Reported Symptoms:  anxiety, depression(improved), bipolar (doing pretty good right now)    Mental Status Exam:  Appearance:   Casual     Behavior:  Appropriate, Sharing, and Motivated  Motor:  Normal  Speech/Language:   Clear and Coherent  Affect:  anxious  Mood:  anxious  Thought process:  goal directed  Thought content:    WNL  Sensory/Perceptual disturbances:    WNL  Orientation:  oriented to person, place, time/date, situation, day of week, month of year, year, and stated date of Oct. 7, 2025  Attention:  Good  Concentration:  Good  Memory:  WNL  Fund of knowledge:   Good  Insight:    Good and Fair  Judgment:   Good  Impulse Control:  Good   Risk Assessment: Danger to Self:  No Self-injurious Behavior: No Danger to Others: No Duty to Warn:no Physical Aggression / Violence:No  Access to Firearms a concern: No  Gang Involvement:No   Subjective:  Patient today working in session further on his anxiety, anger management , family issues, especially a concern with 75 yr old grandson which he shared confidentially. Upset today over work-related issues which he processed today in session and was calmer by session end. Family issues are more calm for right now.  Processes some of his frustrations with work situations, looking at what he can do and what he cannot do to possibly help. Back pain reduced some. Things some better at times and does show some increased patience at times. Encouraged patient to be exercising more patience with situations at home and work, giving specific examples.  Used to have some extended family issues that affect patient but not as angry today.  Encouraged him and working on further improving family communication, without judgment and without jumping to conclusions and  making assumptions that may not be true.  Less resentment today.   Interventions: Cognitive Behavioral Therapy, Solution-Oriented/Positive Psychology, and Ego-Supportive Reduce overall level, frequency, and intensity of the anxiety so that daily functioning is not impaired. Increase understanding of beliefs and messages that produce the worry and anxiety. Identify and use specific coping strategies for anxiety reduction.    Diagnosis:   ICD-10-CM   1. Generalized anxiety disorder  F41.1      Plan:  Patient showing good motivation and interaction in session as he worked further on his goals regarding his anxiety and daily functioning, believes that tend to produce his anxiety, and improved coping strategies for anxiety reduction and better relationships within the family.  Reports that he is trying to better manage his anger and frustration, along with his overall communication within the family.  Reminded patient in paying better attention to how he says what he says as this is a factor in several relationships including home and work.  Patient feels that he is doing better at work and home as he continues to work on his communication and handling stress and more effective ways.  Encouraged patient in his practice of positive and self affirming behaviors as noted in sessions including: Remain in the present and focused on what he can change versus cannot change, pay attention to his anger and try to manage it more effectively, stay in contact with supportive people, intentionally work to have more patience with others and in relationships, letting go  of things from the past that hold him back now, and practice using more improved listening skills to really hear what other people say to him before he responds, while realizing the strength he shows as he works with goal-directed behaviors helping him move in a more positive and hopeful direction.  Goal review and progress/challenges noted with  patient.  Next appointment within 3 weeks.   Barnie Bunde, LCSW

## 2024-04-01 ENCOUNTER — Ambulatory Visit: Admitting: Psychiatry

## 2024-04-10 ENCOUNTER — Ambulatory Visit: Admitting: Psychiatry

## 2024-04-10 ENCOUNTER — Telehealth: Admitting: Adult Health

## 2024-04-10 ENCOUNTER — Encounter: Payer: Self-pay | Admitting: Adult Health

## 2024-04-10 DIAGNOSIS — F411 Generalized anxiety disorder: Secondary | ICD-10-CM

## 2024-04-10 DIAGNOSIS — G4700 Insomnia, unspecified: Secondary | ICD-10-CM

## 2024-04-10 DIAGNOSIS — F319 Bipolar disorder, unspecified: Secondary | ICD-10-CM | POA: Diagnosis not present

## 2024-04-10 MED ORDER — LAMOTRIGINE 100 MG PO TABS
100.0000 mg | ORAL_TABLET | Freq: Two times a day (BID) | ORAL | 1 refills | Status: AC
Start: 2024-04-10 — End: ?

## 2024-04-10 MED ORDER — TRAZODONE HCL 50 MG PO TABS
50.0000 mg | ORAL_TABLET | Freq: Every day | ORAL | 2 refills | Status: AC
Start: 2024-04-10 — End: ?

## 2024-04-10 NOTE — Progress Notes (Signed)
 [facility] Counselor/Therapist Progress Note  Patient ID: Andrew Glover, MRN: [id number],    Date: 04/10/2024  Time Spent: 55 minutes   Treatment Type: Individual Therapy  Reported Symptoms: anxiety, bipolar (doing some better right now)    Mental Status Exam:  Appearance:   Casual     Behavior:  Appropriate, Sharing, Motivated, and sometimes easily agitated by family members  Motor:  Normal  Speech/Language:   Clear and Coherent  Affect:  anxious  Mood:  anxious  Thought process:  goal directed  Thought content:    Some ruminating but not a lot  Sensory/Perceptual disturbances:    WNL  Orientation:  oriented to person, place, time/date, situation, day of week, month of year, year, and stated date of Oct. 30, 2025  Attention:  Good  Concentration:  Good  Memory:  WNL  Fund of knowledge:   Good  Insight:    Good and Fair  Judgment:   Good  Impulse Control:  Good   Risk Assessment: Danger to Self:  No Self-injurious Behavior: No Danger to Others: No Duty to Warn:no Physical Aggression / Violence:No  Access to Firearms a concern: No  Gang Involvement:No   Subjective:  Patient today needing to work further on some individual and marital-related anger and anxiety, and family issues. Processing today more of his marital and family irritations and his trying to remain more calm. Talking through more of his frustration at home some irresponsibility. (Not all details included in this note.) Needed time in session today to further work through frustrations in close relationships within family. (Not all details included in this note due to patient privacy needs.). Work-related stressor also a factor. Discussed some strategies for patient trying to better manage heightened stress, anxiety, or anger. Work-related stressors a factor more so more recently. States he is less angry about some of his financial issues as he is now managing his money versus anyone else in family.  Encourage more connections with friends and others that are healthier for him. Also encouraged his working further  on his communication especially within family, and work on healthier ways of managing his frustration and anger and discussed today in session.   Interventions: Cognitive Behavioral Therapy, Solution-Oriented/Positive Psychology, and Ego-Supportive Reduce overall level, frequency, and intensity of the anxiety so that daily functioning is not impaired. Increase understanding of beliefs and messages that produce the worry and anxiety. Identify and use specific coping strategies for anxiety reduction what works versus does not work, and repeat the coping strategies that seem to be more effective.    Diagnosis:   ICD-10-CM   1. Generalized anxiety disorder  F41.1      Plan: Patient today working further on his anxiety re: family and individual, particularly regarding marital relationship. Trying to better manage emotions and not over-react especially in relationships within family.  This is a very significant need for this patient not only within the family but also in work environment and beyond.  He is talking in more detail about this which is a good sign and has not gotten any difficulty that I am aware of on his job.  Continue to encourage him in paying attention to how he says what he says, and his practice of more positive and self affirming behaviors, staying in the present and focused on what he can change versus cannot change, paying attention to his anger and trying to manage it more effectively, stay in contact with supportive people, intentionally work  to have more patience with others, letting go of things from the past that hold him back now, and practice using more improved listening skills to really hear what other people are saying to him before he responds, while recognizing the strength he shows as he works with goal-directed behaviors to help him move forward in a more  positive and hopeful direction into the future.  Goal review and progress/challenges noted with patient.  Next appointment within 3 weeks.   Barnie Bunde, LCSW

## 2024-04-10 NOTE — Progress Notes (Signed)
 Andrew Glover [id number] May 30, 1961 63 y.o.  Virtual Visit via Video Note  I connected with pt @ on 04/10/24 at  8:00 AM EDT by a video enabled telemedicine application and verified that I am speaking with the correct person using two identifiers.   I discussed the limitations of evaluation and management by telemedicine and the availability of in person appointments. The patient expressed understanding and agreed to proceed.  I discussed the assessment and treatment plan with the patient. The patient was provided an opportunity to ask questions and all were answered. The patient agreed with the plan and demonstrated an understanding of the instructions.   The patient was advised to call back or seek an in-person evaluation if the symptoms worsen or if the condition fails to improve as anticipated.  I provided 25 minutes of non-face-to-face time during this encounter.  The patient was located at home.  The provider was located at [facility].   Angeline LOISE Sayers, NP   Subjective:   Patient ID:  Andrew Glover is a 63 y.o. (DOB 1960/08/05) male.  Chief Complaint: No chief complaint on file.   HPI [name] presents for follow-up of BPD-1, GAD, and insomnia.  Describes mood today as ok. Pleasant. Denies tearfulness. Mood symptoms - denies depression, anxiety and irritability. Reports stable interest and motivation. Denies panic attacks. Denies over thinking. Denies obsessive thoughts and acts. Reports mood is stable. Stating I feel like I'm doing ok. Taking medications as prescribed.  Energy levels stable. Active, does not have a regular exercise routine.  Enjoys some usual interests and activities. Married. Lives with wife. Has 3 step children. Spending time with family and friends. Appetite adequate. Weight stable - 200+ pounds. Sleeps well most nights. Averages 6 hours. Focus and concentration stable. Completing tasks. Managing aspects of household. Works at  [employer]. Denies SI or HI.  Denies AH or VH. Denies self harm. Denies substance use.  Previous medication trials: Abilify , Lamictal , Latuda, Lithium, Rexulti, Trileptal, Geodon   Review of Systems:  Review of Systems  Musculoskeletal:  Negative for gait problem.  Neurological:  Negative for tremors.  Psychiatric/Behavioral:         Please refer to HPI    Medications: I have reviewed the patient's current medications.  Current Outpatient Medications  Medication Sig Dispense Refill   ascorbic acid (VITAMIN C) 1000 MG tablet Take 1,000 mg by mouth daily.     aspirin 81 MG EC tablet Take 1 tablet by mouth every morning.     atenolol -chlorthalidone  (TENORETIC ) 50-25 MG tablet Take 1 tablet by mouth daily. 30 tablet 1   atorvastatin  (LIPITOR) 40 MG tablet Take 1 tablet (40 mg total) by mouth daily. Take 40 mg by mouth daily. 30 tablet 1   cetirizine (ZYRTEC) 10 MG tablet Take 10 mg by mouth daily.     clobetasol cream (TEMOVATE) 0.05 % Apply 1 Application topically as needed.     ergocalciferol (VITAMIN D2) 1.25 MG (50000 UT) capsule Take by mouth.     lamoTRIgine  (LAMICTAL ) 100 MG tablet Take 1 tablet (100 mg total) by mouth 2 (two) times daily. 180 tablet 1   losartan  (COZAAR ) 100 MG tablet Take 1 tablet (100 mg total) by mouth daily. 30 tablet 1   metFORMIN (GLUCOPHAGE-XR) 500 MG 24 hr tablet Take 500 mg by mouth 2 (two) times daily.     methylPREDNISolone  (MEDROL ) 4 MG tablet Taper from 6 pills po for one day to 1 pill po the last day  over 6 days 21 tablet 0   Multiple Vitamin (MULTIVITAMIN PO) Take 1 tablet by mouth daily.     Roflumilast  (ZORYVE ) 0.3 % CREA Apply 1 Application topically daily. 60 g 3   tacrolimus  (PROTOPIC ) 0.1 % ointment Apply topically 2 (two) times daily. Apply to groin twice daily 100 g 2   traZODone  (DESYREL ) 50 MG tablet Take 1 tablet (50 mg total) by mouth at bedtime. 30 tablet 2   triamcinolone  cream (KENALOG ) 0.1 % Apply 1 application topically 2 (two) times  daily. (Patient taking differently: Apply 1 application  topically as needed.) 30 g 0   No current facility-administered medications for this visit.    Medication Side Effects: None  Allergies:  Allergies  Allergen Reactions   Aripiprazole      Other Reaction(s): akathisias, weight gain on 5 mg daily (43 lbs over 6 yrs.Only 6 lbs and no akathisias over 2 years.   Brexpiprazole     Other Reaction(s): itchy ankles, not effective on 1.5 mg qd   Lamotrigine      Other Reaction(s): no SE, not effective   Lithium     Other Reaction(s): no SE, not effective   Lurasidone     Other Reaction(s): increased appetite, not as laid back:   Quetiapine     Other Reaction(s): sleepy, increased weight, not effective   Sulfa Antibiotics Hives and Itching   Ziprasidone      Other Reaction(s): drowsiness on 40 mg twice daily   Codeine Itching and Rash   Sulfamethoxazole Rash   Sulfamethoxazole-Trimethoprim Rash    Past Medical History:  Diagnosis Date   Bipolar 1 disorder (HCC)    Gout    Hyperlipidemia    Hypertension     Family History  Problem Relation Age of Onset   Stroke Mother    Rheumatic fever Mother    Heart Problems Mother    High blood pressure Father    High Cholesterol Father    Diabetes Father    Alzheimer's disease Father    Multiple myeloma Father     Social History   Socioeconomic History   Marital status: Married    Spouse name: Not on file   Number of children: Not on file   Years of education: Not on file   Highest education level: Not on file  Occupational History   Not on file  Tobacco Use   Smoking status: Former    Types: Cigarettes   Smokeless tobacco: Never  Vaping Use   Vaping status: Never Used  Substance and Sexual Activity   Alcohol use: Not Currently    Alcohol/week: 42.0 standard drinks of alcohol    Types: 42 Cans of beer per week   Drug use: Not Currently    Types: Marijuana   Sexual activity: Yes  Other Topics Concern   Not on file   Social History Narrative   Right handed   Caffeine use: 1 cup coffee per day   Social Drivers of Health   Financial Resource Strain: Not on file  Food Insecurity: Not on file  Transportation Needs: Not on file  Physical Activity: Not on file  Stress: Not on file (04/18/2023)  Social Connections: Not on file  Intimate Partner Violence: Low Risk  (12/29/2019)   Received from [facility]   Intimate Partner Violence    Insults You: Not on file    Threatens You: Not on file    Screams at You: Not on file    Physically Hurt: Not on  file    Intimate Partner Violence Score: Not on file    Past Medical History, Surgical history, Social history, and Family history were reviewed and updated as appropriate.   Please see review of systems for further details on the patient's review from today.   Objective:   Physical Exam:  There were no vitals taken for this visit.  Physical Exam Constitutional:      General: He is not in acute distress. Musculoskeletal:        General: No deformity.  Neurological:     Mental Status: He is alert and oriented to person, place, and time.     Coordination: Coordination normal.  Psychiatric:        Attention and Perception: Attention and perception normal. He does not perceive auditory or visual hallucinations.        Mood and Affect: Mood normal. Mood is not anxious or depressed. Affect is not labile, blunt, angry or inappropriate.        Speech: Speech normal.        Behavior: Behavior normal.        Thought Content: Thought content normal. Thought content is not paranoid or delusional. Thought content does not include homicidal or suicidal ideation. Thought content does not include homicidal or suicidal plan.        Cognition and Memory: Cognition and memory normal.        Judgment: Judgment normal.     Comments: Insight intact     Lab Review:     Component Value Date/Time   NA 140 03/16/2023 2131   K 3.7 03/16/2023 2131   CL 104  03/16/2023 2131   CO2 29 03/16/2023 2131   GLUCOSE 144 (H) 03/16/2023 2131   BUN 19 03/16/2023 2131   CREATININE 0.94 03/16/2023 2131   CALCIUM  9.2 03/16/2023 2131   PROT 6.5 03/16/2023 2131   ALBUMIN 4.2 03/16/2023 2131   AST 18 03/16/2023 2131   ALT 21 03/16/2023 2131   ALKPHOS 78 03/16/2023 2131   BILITOT 0.5 03/16/2023 2131   GFRNONAA >60 03/16/2023 2131   GFRAA >90 03/20/2011 1955       Component Value Date/Time   WBC 8.3 03/16/2023 2131   RBC 5.07 03/16/2023 2131   HGB 15.8 03/16/2023 2131   HCT 44.2 03/16/2023 2131   PLT 187 03/16/2023 2131   MCV 87.2 03/16/2023 2131   MCH 31.2 03/16/2023 2131   MCHC 35.7 03/16/2023 2131   RDW 13.4 03/16/2023 2131   LYMPHSABS 2.2 03/16/2023 2131   MONOABS 0.6 03/16/2023 2131   EOSABS 0.2 03/16/2023 2131   BASOSABS 0.0 03/16/2023 2131    No results found for: POCLITH, LITHIUM   No results found for: PHENYTOIN, PHENOBARB, VALPROATE, CBMZ   .res Assessment: Plan:    Plan:  PDMP reviewed  Working with therapist - Marval Bunde  Trazadone 50mg  at HS as needed for sleep - one to tablets at hs  Lamictal  100mg  at hs and 100mg  in the morning.   RTC 4 weeks  25 minutes spent dedicated to the care of this patient on the date of this encounter to include pre-visit review of records, ordering of medication, post visit documentation, and face-to-face time with the patient discussing BPD-1, anxiety and insomnia. Discussed continuing current medication regimen.  Patient advised to contact office with any questions, adverse effects, or acute worsening in signs and symptoms.  Counseled patient regarding potential benefits, risks, and side effects of Lamictal  to include potential risk of Stevens-Johnson syndrome.  Advised patient to stop taking Lamictal  and contact office immediately if rash develops and to seek urgent medical attention if rash is severe and/or spreading quickly.    There are no diagnoses linked to this  encounter.   Please see After Visit Summary for patient specific instructions.  Future Appointments  Date Time Provider Department Center  04/10/2024  8:00 AM Antoinne Spadaccini, Angeline Mattocks, NP [facility] None  04/10/2024  4:00 PM Sherlynn Sober, LCSW [facility] None  04/16/2024  5:00 PM Sherlynn Sober, LCSW [facility] None  04/23/2024  3:15 PM Alm Delon SAILOR, DO [facility] None  05/19/2024  5:00 PM Sherlynn Sober, LCSW [facility] None  06/10/2024  9:00 AM Sherlynn Sober, LCSW [facility] None  06/23/2024  5:00 PM Sherlynn Sober, LCSW [facility] None    No orders of the defined types were placed in this encounter.     -------------------------------

## 2024-04-16 ENCOUNTER — Ambulatory Visit: Admitting: Psychiatry

## 2024-04-16 DIAGNOSIS — F319 Bipolar disorder, unspecified: Secondary | ICD-10-CM

## 2024-04-16 NOTE — Progress Notes (Signed)
 [facility] Counselor/Therapist Progress Note  Patient ID: Andrew Glover, MRN: [id number],    Date: 04/16/2024  Time Spent: 55 minutes   Treatment Type: Individual Therapy  Reported Symptoms:  anxiety, bipolar (some better)    Mental Status Exam:  Appearance:   Casual     Behavior:  Appropriate, Sharing, and Motivated  Motor:  Normal  Speech/Language:   Clear and Coherent  Affect:  Anxious, some irritability  Mood:  anxious and some depression  Thought process:  goal directed  Thought content:    WNL  Sensory/Perceptual disturbances:    WNL  Orientation:  oriented to person, place, time/date, situation, day of week, month of year, year, and stated date of Nov. 5, 2025  Attention:  Good  Concentration:  Good  Memory:  WNL  Fund of knowledge:   Good  Insight:    Good and Fair  Judgment:   Good  Impulse Control:  Good   Risk Assessment: Danger to Self:  No Self-injurious Behavior: No Danger to Others: No Duty to Warn:no Physical Aggression / Violence:No  Access to Firearms a concern: No  Gang Involvement:No   Subjective:    Patient in session today reporting his need to work further on marital related anger and anxiety, and other individual/family issues. Reporting some communication issues  within family but some better this past week. States wife has been some better in her communication. Also sharing that family is coming at Thanksgiving but he is not allowing them to spend there night, due to prior issues with them, which he shared and worked on some resolution in session today. Planning upcoming trip for friend/co-worker who has cancer, and looking forward to this. Mood overall is some better today and I asked patient what was contributing to his mood improvement and he acknowledged some to the work he is doing with himself but also add that looking forward to Christmas time and music helps his mood.Work going ok, less stress overall.  Encouraged him to continue  working on better managing his stress, anger, and anxiety especially when it is heightened.  He feels he handles work-related stressors fairly well.  Encouraged patient in healthier relationships and connections with friends, making good choices regarding people that are healthier for him to continue working on improved communication within the family and more effective ways of managing stress and frustration.       Interventions: Cognitive Behavioral Therapy, Solution-Oriented/Positive Psychology, and Ego-Supportive Reduce overall level, frequency, and intensity of the anxiety so that daily functioning is not impaired. Increase understanding of beliefs and messages that produce the worry and anxiety. Identify and use specific coping strategies for anxiety reduction what works versus does not work, and repeat the coping strategies that seem to be more effective.   Diagnosis:   ICD-10-CM   1. Bipolar I disorder (HCC)  F31.9      Plan: Patient today in session and showing good motivation as he continued his work on anxiety particularly within family and individual relationships and regarding specifically his marital relationship.  Trying to not overreact and relationships within the family and also better manage emotions at home and beyond.  Definitely noticing some reduction in the level, frequency, and intensity of his anxiety to where it is not impairing his daily functioning as often nor as much.  I continue to encourage patient in more positive ways of dealing with stressors and more positive ways of interacting with others including how he says what he  says, his practice of more positive and self affirming behaviors, staying in the present and focused on what he can change versus cannot, paying attention to his anger and trying to manage it more effectively, staying in contact with supportive people, intentionally work to have more patience with others, letting go of things from the past that  hold him back now, and practice using improved listening skills to really hear what others are saying to him before he responds, while recognizing the strength he can show if he works with goal-directed behaviors helping him to move forward in a more positive and hopeful direction.  Goal review and progress/challenges noted with patient.  Next appointment within 3 weeks.   Barnie Bunde, LCSW

## 2024-04-23 ENCOUNTER — Ambulatory Visit: Admitting: Dermatology

## 2024-04-23 ENCOUNTER — Encounter: Payer: Self-pay | Admitting: Dermatology

## 2024-04-23 DIAGNOSIS — L304 Erythema intertrigo: Secondary | ICD-10-CM

## 2024-04-23 DIAGNOSIS — L750 Bromhidrosis: Secondary | ICD-10-CM | POA: Diagnosis not present

## 2024-04-23 NOTE — Progress Notes (Signed)
   Follow-Up Visit   Subjective  Andrew Glover is a 63 y.o. male established patient who presents for FOLLOW UP on the diagnoses listed below:  Patient (and/or pt guardian) consented to the use of AI-assisted tools for note generation.   Patient was last evaluated on 02/21/24.   Intertrigo/Tinea Cruitis: Prescribed tacrolimus  to mix with A[place]e cicalfate to apply to groin area. Recommended using over unscented deodorant for sweat control. Patient reports sxs are better but not fully resolved. Groin is still itchy without topical rating it 4/10, with topical 1/10.   The following portions of the chart were reviewed this encounter and updated as appropriate: medications, allergies, medical history  Review of Systems:  No other skin or systemic complaints except as noted in HPI or Assessment and Plan.  Objective  Well appearing patient in no apparent distress; mood and affect are within normal limits.   A focused examination was performed of the following areas: groin   Relevant exam findings are noted in the Assessment and Plan.    Assessment & Plan   Intertrigo Chronic intertrigo with itching and irritation, primarily in areas where skin rubs against skin and sweat accumulates. Current treatment with tacrolimus  and ciclophate is effective, reducing symptoms to a 1 out of 10 severity. Symptoms are well-controlled with current regimen, though occasional reapplication is necessary. - Continue tacrolimus  and ciclophate mixture as currently applied. - Educated on mixing equal parts of tacrolimus  and ciclophate for application. - Advised reapplication of creams as needed, especially if symptoms increase.  Pseudobromohidrosis (perceived malodor) Perceived malodor likely due to pseudobromohidrosis, where there is a perception of odor without actual malodor. Sweating may be contributing to the perception of odor. Previous recommendation of Dove deodorant was not tried. Epsom salt baths are  being used to manage symptoms. - Recommended using Dove deodorant spray to manage perceived odor. - Advised on the use of Epsom salt baths once or twice a week to help with perspiration and skin calming. - Educated on the perception aspect of pseudobromohidrosis and the role of sweating in perceived odor.    No follow-ups on file.   Documentation: I have reviewed the above documentation for accuracy and completeness, and I agree with the above.  I, Shirron Maranda, CMA, am acting as scribe for [employer], DO.   Delon Lenis, DO

## 2024-04-23 NOTE — Patient Instructions (Addendum)
 VISIT SUMMARY:  Today, we discussed your persistent itching and perceived body odor related to intertrigo. We reviewed your current treatment regimen and addressed your concerns about odor and potential irritation from deodorant use. We also touched on your history of gout and upcoming surgeries.  YOUR PLAN:  -INTERTRIGO:  Intertrigo is a skin condition where areas of skin rub together and become inflamed, often in warm, moist areas. You should continue using the tacrolimus  and sucralfate creams as you have been, mixing equal parts of each. Reapply the creams as needed, especially if symptoms increase.  -PSEUDOBROMOHIDROSIS (PERCEIVED MALODOR):  Pseudobromohidrosis is the perception of having a bad odor without an actual odor being present.  To manage this, start using Dove deodorant spray and continue taking Epsom salt baths once or twice a week to help with perspiration and calming your skin.   Remember, the odor you perceive may be influenced by sweating.  INSTRUCTIONS:  Please follow up as needed if your symptoms worsen or if you have any concerns about your treatment. Continue with your current regimen and try the new recommendations for managing perceived odor.      Important Information  Due to recent changes in healthcare laws, you may see results of your pathology and/or laboratory studies on MyChart before the doctors have had a chance to review them. We understand that in some cases there may be results that are confusing or concerning to you. Please understand that not all results are received at the same time and often the doctors may need to interpret multiple results in order to provide you with the best plan of care or course of treatment. Therefore, we ask that you please give us  2 business days to thoroughly review all your results before contacting the office for clarification. Should we see a critical lab result, you will be contacted sooner.   If You Need Anything  After Your Visit  If you have any questions or concerns for your doctor, please call our main line at [phone] If no one answers, please leave a voicemail as directed and we will return your call as soon as possible. Messages left after 4 pm will be answered the following business day.   You may also send us  a message via MyChart. We typically respond to MyChart messages within 1-2 business days.  For prescription refills, please ask your pharmacy to contact our office. Our fax number is [phone].  If you have an urgent issue when the clinic is closed that cannot wait until the next business day, you can page your doctor at the number below.    Please note that while we do our best to be available for urgent issues outside of office hours, we are not available 24/7.   If you have an urgent issue and are unable to reach us , you may choose to seek medical care at your doctor's office, retail clinic, urgent care center, or emergency room.  If you have a medical emergency, please immediately call 911 or go to the emergency department. In the event of inclement weather, please call our main line at [phone] for an update on the status of any delays or closures.  Dermatology Medication Tips: Please keep the boxes that topical medications come in in order to help keep track of the instructions about where and how to use these. Pharmacies typically print the medication instructions only on the boxes and not directly on the medication tubes.   If your medication is too expensive, please  contact our office at [phone] or send us  a message through MyChart.   We are unable to tell what your co-pay for medications will be in advance as this is different depending on your insurance coverage. However, we may be able to find a substitute medication at lower cost or fill out paperwork to get insurance to cover a needed medication.   If a prior authorization is required to get your medication  covered by your insurance company, please allow us  1-2 business days to complete this process.  Drug prices often vary depending on where the prescription is filled and some pharmacies may offer cheaper prices.  The website www.goodrx.com contains coupons for medications through different pharmacies. The prices here do not account for what the cost may be with help from insurance (it may be cheaper with your insurance), but the website can give you the price if you did not use any insurance.  - You can print the associated coupon and take it with your prescription to the pharmacy.  - You may also stop by our office during regular business hours and pick up a GoodRx coupon card.  - If you need your prescription sent electronically to a different pharmacy, notify our office through [facility] or by phone at [phone]

## 2024-04-26 DIAGNOSIS — G4733 Obstructive sleep apnea (adult) (pediatric): Secondary | ICD-10-CM | POA: Diagnosis not present

## 2024-04-27 ENCOUNTER — Encounter: Payer: Self-pay | Admitting: Dermatology

## 2024-05-01 DIAGNOSIS — S46011A Strain of muscle(s) and tendon(s) of the rotator cuff of right shoulder, initial encounter: Secondary | ICD-10-CM | POA: Diagnosis not present

## 2024-05-01 DIAGNOSIS — M7521 Bicipital tendinitis, right shoulder: Secondary | ICD-10-CM | POA: Diagnosis not present

## 2024-05-07 ENCOUNTER — Encounter: Payer: Self-pay | Admitting: Adult Health

## 2024-05-07 ENCOUNTER — Telehealth: Admitting: Adult Health

## 2024-05-07 DIAGNOSIS — G4700 Insomnia, unspecified: Secondary | ICD-10-CM | POA: Diagnosis not present

## 2024-05-07 DIAGNOSIS — F319 Bipolar disorder, unspecified: Secondary | ICD-10-CM

## 2024-05-07 DIAGNOSIS — F411 Generalized anxiety disorder: Secondary | ICD-10-CM

## 2024-05-07 NOTE — Progress Notes (Signed)
 Andrew Glover [id number] [birth year] 63 y.o.  Virtual Visit via Video Note  I connected with pt @ on 05/07/24 at  5:30 PM EST by a video enabled telemedicine application and verified that I am speaking with the correct person using two identifiers.   I discussed the limitations of evaluation and management by telemedicine and the availability of in person appointments. The patient expressed understanding and agreed to proceed.  I discussed the assessment and treatment plan with the patient. The patient was provided an opportunity to ask questions and all were answered. The patient agreed with the plan and demonstrated an understanding of the instructions.   The patient was advised to call back or seek an in-person evaluation if the symptoms worsen or if the condition fails to improve as anticipated.  I provided 15 minutes of non-face-to-face time during this encounter.  The patient was located at home.  The provider was located at [facility].   Angeline LOISE Sayers, NP   Subjective:   Patient ID:  Andrew Glover is a 63 y.o. (DOB 04/14/1961) male.  Chief Complaint: No chief complaint on file.   HPI DEVEN FURIA presents for follow-up of BPD-1, GAD, and insomnia.  Describes mood today as ok. Pleasant. Denies tearfulness. Mood symptoms - denies depression, anxiety and irritability. Reports stable interest and motivation. Denies panic attacks. Denies over thinking. Denies obsessive thoughts and acts. Reports mood is stable. Stating I feel like I'm doing good. Taking medications as prescribed.  Energy levels stable. Active, does not have a regular exercise routine.  Enjoys some usual interests and activities. Married. Lives with wife. Has 3 step children. Spending time with family and friends. Appetite adequate. Weight stable - 200+ pounds. Sleeps well most nights. Averages 6 hours. Focus and concentration stable. Completing tasks. Managing aspects of household. Works at  [employer]. Denies SI or HI.  Denies AH or VH. Denies self harm. Denies substance use.  Previous medication trials: Abilify , Lamictal , Latuda, Lithium, Rexulti, Trileptal, Geodon    Review of Systems:  Review of Systems  Musculoskeletal:  Negative for gait problem.  Neurological:  Negative for tremors.  Psychiatric/Behavioral:         Please refer to HPI    Medications: I have reviewed the patient's current medications.  Current Outpatient Medications  Medication Sig Dispense Refill   ascorbic acid (VITAMIN C) 1000 MG tablet Take 1,000 mg by mouth daily.     aspirin 81 MG EC tablet Take 1 tablet by mouth every morning.     atenolol -chlorthalidone  (TENORETIC ) 50-25 MG tablet Take 1 tablet by mouth daily. 30 tablet 1   atorvastatin  (LIPITOR) 40 MG tablet Take 1 tablet (40 mg total) by mouth daily. Take 40 mg by mouth daily. 30 tablet 1   cetirizine (ZYRTEC) 10 MG tablet Take 10 mg by mouth daily.     clobetasol cream (TEMOVATE) 0.05 % Apply 1 Application topically as needed.     ergocalciferol (VITAMIN D2) 1.25 MG (50000 UT) capsule Take by mouth.     lamoTRIgine  (LAMICTAL ) 100 MG tablet Take 1 tablet (100 mg total) by mouth 2 (two) times daily. 180 tablet 1   losartan  (COZAAR ) 100 MG tablet Take 1 tablet (100 mg total) by mouth daily. 30 tablet 1   meloxicam (MOBIC) 15 MG tablet Take 15 mg by mouth.     metFORMIN (GLUCOPHAGE-XR) 500 MG 24 hr tablet Take 500 mg by mouth 2 (two) times daily.     methylPREDNISolone  (MEDROL ) 4 MG tablet  Taper from 6 pills po for one day to 1 pill po the last day over 6 days 21 tablet 0   Multiple Vitamin (MULTIVITAMIN PO) Take 1 tablet by mouth daily.     Roflumilast  (ZORYVE ) 0.3 % CREA Apply 1 Application topically daily. 60 g 3   tacrolimus  (PROTOPIC ) 0.1 % ointment Apply topically 2 (two) times daily. Apply to groin twice daily 100 g 2   traZODone  (DESYREL ) 50 MG tablet Take 1 tablet (50 mg total) by mouth at bedtime. 30 tablet 2   triamcinolone  cream  (KENALOG ) 0.1 % Apply 1 application topically 2 (two) times daily. (Patient taking differently: Apply 1 application  topically as needed.) 30 g 0   No current facility-administered medications for this visit.    Medication Side Effects: None  Allergies:  Allergies  Allergen Reactions   Aripiprazole      Other Reaction(s): akathisias, weight gain on 5 mg daily (43 lbs over 6 yrs.Only 6 lbs and no akathisias over 2 years.   Brexpiprazole     Other Reaction(s): itchy ankles, not effective on 1.5 mg qd   Lamotrigine      Other Reaction(s): no SE, not effective   Lithium     Other Reaction(s): no SE, not effective   Lurasidone     Other Reaction(s): increased appetite, not as laid back:   Quetiapine     Other Reaction(s): sleepy, increased weight, not effective   Sulfa Antibiotics Hives and Itching   Ziprasidone      Other Reaction(s): drowsiness on 40 mg twice daily   Codeine Itching and Rash   Sulfamethoxazole Rash   Sulfamethoxazole-Trimethoprim Rash    Past Medical History:  Diagnosis Date   Bipolar 1 disorder (HCC)    Gout    Hyperlipidemia    Hypertension     Family History  Problem Relation Age of Onset   Stroke Mother    Rheumatic fever Mother    Heart Problems Mother    High blood pressure Father    High Cholesterol Father    Diabetes Father    Alzheimer's disease Father    Multiple myeloma Father     Social History   Socioeconomic History   Marital status: Married    Spouse name: Not on file   Number of children: Not on file   Years of education: Not on file   Highest education level: Not on file  Occupational History   Not on file  Tobacco Use   Smoking status: Former    Types: Cigarettes   Smokeless tobacco: Never  Vaping Use   Vaping status: Never Used  Substance and Sexual Activity   Alcohol use: Not Currently    Alcohol/week: 42.0 standard drinks of alcohol    Types: 42 Cans of beer per week   Drug use: Not Currently    Types: Marijuana    Sexual activity: Yes  Other Topics Concern   Not on file  Social History Narrative   Right handed   Caffeine use: 1 cup coffee per day   Social Drivers of Health   Financial Resource Strain: Not on file  Food Insecurity: Not on file  Transportation Needs: Not on file  Physical Activity: Not on file  Stress: Not on file (04/18/2023)  Social Connections: Not on file  Intimate Partner Violence: Low Risk (12/29/2019)   Received from [facility]   Intimate Partner Violence    Insults You: Not on file    Threatens You: Not on file  Screams at You: Not on file    Physically Hurt: Not on file    Intimate Partner Violence Score: Not on file    Past Medical History, Surgical history, Social history, and Family history were reviewed and updated as appropriate.   Please see review of systems for further details on the patient's review from today.   Objective:   Physical Exam:  There were no vitals taken for this visit.  Physical Exam Constitutional:      General: He is not in acute distress. Musculoskeletal:        General: No deformity.  Neurological:     Mental Status: He is alert and oriented to person, place, and time.     Coordination: Coordination normal.  Psychiatric:        Attention and Perception: Attention and perception normal. He does not perceive auditory or visual hallucinations.        Mood and Affect: Mood normal. Mood is not anxious or depressed. Affect is not labile, blunt, angry or inappropriate.        Speech: Speech normal.        Behavior: Behavior normal.        Thought Content: Thought content normal. Thought content is not paranoid or delusional. Thought content does not include homicidal or suicidal ideation. Thought content does not include homicidal or suicidal plan.        Cognition and Memory: Cognition and memory normal.        Judgment: Judgment normal.     Comments: Insight intact     Lab Review:     Component Value Date/Time   NA  140 03/16/2023 2131   K 3.7 03/16/2023 2131   CL 104 03/16/2023 2131   CO2 29 03/16/2023 2131   GLUCOSE 144 (H) 03/16/2023 2131   BUN 19 03/16/2023 2131   CREATININE 0.94 03/16/2023 2131   CALCIUM  9.2 03/16/2023 2131   PROT 6.5 03/16/2023 2131   ALBUMIN 4.2 03/16/2023 2131   AST 18 03/16/2023 2131   ALT 21 03/16/2023 2131   ALKPHOS 78 03/16/2023 2131   BILITOT 0.5 03/16/2023 2131   GFRNONAA >60 03/16/2023 2131   GFRAA >90 03/20/2011 1955       Component Value Date/Time   WBC 8.3 03/16/2023 2131   RBC 5.07 03/16/2023 2131   HGB 15.8 03/16/2023 2131   HCT 44.2 03/16/2023 2131   PLT 187 03/16/2023 2131   MCV 87.2 03/16/2023 2131   MCH 31.2 03/16/2023 2131   MCHC 35.7 03/16/2023 2131   RDW 13.4 03/16/2023 2131   LYMPHSABS 2.2 03/16/2023 2131   MONOABS 0.6 03/16/2023 2131   EOSABS 0.2 03/16/2023 2131   BASOSABS 0.0 03/16/2023 2131    No results found for: POCLITH, LITHIUM   No results found for: PHENYTOIN, PHENOBARB, VALPROATE, CBMZ   .res Assessment: Plan:    Plan:  PDMP reviewed  Working with therapist - Marval Bunde  Trazadone 50mg  at HS as needed for sleep - take one to tablets   Lamictal  100mg  at hs and 100mg  in the morning.   RTC 4 weeks  15 minutes spent dedicated to the care of this patient on the date of this encounter to include pre-visit review of records, ordering of medication, post visit documentation, and face-to-face time with the patient discussing BPD-1, anxiety and insomnia. Discussed continuing current medication regimen.  Patient advised to contact office with any questions, adverse effects, or acute worsening in signs and symptoms.  Counseled patient regarding potential benefits,  risks, and side effects of Lamictal  to include potential risk of Stevens-Johnson syndrome. Advised patient to stop taking Lamictal  and contact office immediately if rash develops and to seek urgent medical attention if rash is severe and/or spreading  quickly.   There are no diagnoses linked to this encounter.   Please see After Visit Summary for patient specific instructions.  Future Appointments  Date Time Provider Department Center  05/19/2024  5:00 PM Sherlynn Sober, LCSW [facility] None  06/10/2024  9:00 AM Sherlynn Sober, LCSW [facility] None  06/23/2024  5:00 PM Sherlynn Sober, LCSW [facility] None  12/23/2024  3:45 PM Alm Delon SAILOR, DO [facility] None    No orders of the defined types were placed in this encounter.     -------------------------------

## 2024-05-19 ENCOUNTER — Ambulatory Visit: Admitting: Psychiatry

## 2024-05-19 DIAGNOSIS — F319 Bipolar disorder, unspecified: Secondary | ICD-10-CM

## 2024-05-19 NOTE — Progress Notes (Signed)
 [facility] Counselor/Therapist Progress Note  Patient ID: Andrew Glover, MRN: [id number],    Date: 05/19/2024  Time Spent: 55 minutes   Treatment Type: Individual Therapy  Reported Symptoms: anxiety, bipolar   Mental Status Exam:  Appearance:   Casual     Behavior:  Appropriate, Sharing, and Motivated  Motor:  Normal  Speech/Language:   Clear and Coherent  Affect:  Depressed and anxiety  Mood:  anxious and depressed  Thought process:  goal directed  Thought content:    WNL  Sensory/Perceptual disturbances:    WNL  Orientation:  oriented to person, place, time/date, situation, day of week, month of year, year, and stated date of Dec. 8, 2025  Attention:  Good  Concentration:  Good  Memory:  WNL  Fund of knowledge:   Good  Insight:    Good and Fair  Judgment:   Good  Impulse Control:  Good   Risk Assessment: Danger to Self:  No Self-injurious Behavior: No Danger to Others: No Duty to Warn:no Physical Aggression / Violence:No  Access to Firearms a concern: No  Gang Involvement:No   Subjective:   Patient motivated and working further today in session on his marital issues in addition to other family/individual related issues. Ongoing issues with wife but stopped marital counseling earlier. Encouraged more listening within marriage and explains how it's difficult for he and wife to be able to differ and talk it through themselves in a calm manner. Very frustrated within family relationships, and not just with wife. Communication is difficult within marriage and family and patient encouraged to try more effective communication strategies. Not trusting with wife. Working more on trust issues within marriage and family, especially his wife's daughter. Processed these issues further in session today, complicated by his lack of trust. Is taking some vacation time from work coming up soon. Is hoping this can allow for some better time at home. States that they came to the  conclusion about some shortcomings related to wife.  Did encourage patient in some changes within some of his thoughts and plans to follow through on that next session as we ran out of time today. Patient commits to picking up on the family issues next session and was calmer upon leaving session today.  Reviewed some better stress management techniques along with not holding onto anger, and thinking before engaging in conversations especially when they can be volatile and easily misunderstood.  Encouraged patient to be making healthier choices in his words and behavior especially within the family.    Interventions: Cognitive Behavioral Therapy, Solution-Oriented/Positive Psychology, and Ego-Supportive Reduce overall level, frequency, and intensity of the anxiety so that daily functioning is not impaired. Increase understanding of beliefs and messages that produce the worry and anxiety. Identify and use specific coping strategies for anxiety reduction what works versus does not work, and repeat the coping strategies that seem to be more effective.   Diagnosis:   ICD-10-CM   1. Bipolar I disorder (HCC)  F31.9      Plan: Patient working well in session today focusing further on family issues, personal issues, anxiety, marital relationship, and communication.  Reports being easily impacted by wife and some of the comments that she makes.  Trying not to overreact in conversations or relationships particularly within the family.  Encouraged patient to be practicing more positive ways of managing stressors and interacting with other people especially within the family being careful as to how he says what he says.  Gave a clear example of this for him.  Trying to stay more in the present and focused on what he can change versus cannot, paying attention to his anger and trying to manage it more effectively, staying in contact with supportive people, intentionally working to have more patience with others,  letting go of things from the past that holds him back now, and trying to use healthier listening skills to really hear what other people are saying before responding to the other person.  Goal review and progress/challenges noted with patient.  Next appointment within 3 weeks.    Barnie Bunde, LCSW

## 2024-05-26 ENCOUNTER — Ambulatory Visit: Admitting: Dermatology

## 2024-05-26 ENCOUNTER — Encounter: Payer: Self-pay | Admitting: Dermatology

## 2024-05-26 DIAGNOSIS — L249 Irritant contact dermatitis, unspecified cause: Secondary | ICD-10-CM | POA: Diagnosis not present

## 2024-05-26 DIAGNOSIS — B368 Other specified superficial mycoses: Secondary | ICD-10-CM

## 2024-05-26 DIAGNOSIS — Z7189 Other specified counseling: Secondary | ICD-10-CM | POA: Diagnosis not present

## 2024-05-26 MED ORDER — TACROLIMUS 0.1 % EX OINT: TOPICAL_OINTMENT | Freq: Two times a day (BID) | CUTANEOUS | 2 refills | Status: AC

## 2024-05-26 MED ORDER — TERBINAFINE HCL 250 MG PO TABS: 250.0000 mg | ORAL_TABLET | Freq: Every day | ORAL | 0 refills | Status: DC

## 2024-05-26 NOTE — Progress Notes (Signed)
° °  Follow-Up Visit   Subjective  Andrew Glover is a 63 y.o. male who presents for the following: rash in the groin persistent for many years, pt c/o severe itching and burning, pt states that Dr. Alm recommended second opinion for tx options. Currently using tacrolimus  ointment, which helps some, but he continues to be symptomatic, and if he doesn't use tacrolimus  the itching is so severe that it keeps him awake at night. 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 Pt was prescribed Jublia  solution for the toenails, but developed a rash on the surrounding skin, but he would like to retry the solution because it did help with nail symptoms.  The following portions of the chart were reviewed this encounter and updated as appropriate: medications, allergies, medical history  Review of Systems:  No other skin or systemic complaints except as noted in HPI or Assessment and Plan.  Objective  Well appearing patient in no apparent distress; mood and affect are within normal limits.   A focused examination was performed of the following areas: the groin   Relevant exam findings are noted in the Assessment and Plan.   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   Inguinal crease erythema and pruritus, recalcitrant, chronic eczema on biopsy 04/2023 Concern for chronic irritation and/or red scrotum syndrome but must rule out Tinea incognito  04/08/24 - recent CMP WNL Exam: erythematous papules and hyperpigmented patches with slight lichenification of R > L inguinal creases  Plan Discussed that diagnosis is unclear.  Tinea incognito could explain persistence, pruritus, improvement with tacrolimus  with immediate recurrence after stopping application Continue tacrolimus  0.1% ointment to aa BID Discussed oral antifungals to rule out tinea incognito/folliculitis Start terbinafine  250 mg po QD.  Terbinafine  Counseling  Terbinafine  is an anti-fungal medicine that can be applied to the skin (over the counter) or taken by mouth (prescription) to treat fungal infections. The pill version is often used to treat fungal infections of the nails or scalp. While most people do not have any side effects from taking terbinafine  pills, some possible side effects of the medicine can include taste changes, headache, loss of smell, vision changes, nausea, vomiting, or diarrhea.   Rare side effects can include irritation of the liver, allergic reaction, or decrease in blood counts (which may show up as not feeling well or developing an infection). If you are concerned about any of these side effects, please stop the medicine and call your doctor, or in the case of an emergency such as feeling very unwell, seek immediate medical care.   If not improvement with terbinafine  at follow up will try fluconazole. TINEA INCOGNITO   This Visit - tacrolimus  (PROTOPIC ) 0.1 % ointment - Apply topically 2 (two) times daily. Apply to groin twice daily IRRITANT CONTACT DERMATITIS, UNSPECIFIED TRIGGER    Return in about 1 month (around 06/26/2024) for rash follow up.  Andrew Glover, CMA, am acting as scribe for Boneta Sharps, MD .   Documentation: I have reviewed the above documentation for accuracy and completeness, and I agree with the above.  Boneta Sharps, MD

## 2024-05-26 NOTE — Patient Instructions (Signed)

## 2024-06-09 DIAGNOSIS — M85679 Other cyst of bone, unspecified ankle and foot: Secondary | ICD-10-CM | POA: Diagnosis not present

## 2024-06-09 DIAGNOSIS — L989 Disorder of the skin and subcutaneous tissue, unspecified: Secondary | ICD-10-CM | POA: Diagnosis not present

## 2024-06-10 ENCOUNTER — Ambulatory Visit: Admitting: Psychiatry

## 2024-06-10 DIAGNOSIS — F319 Bipolar disorder, unspecified: Secondary | ICD-10-CM

## 2024-06-10 NOTE — Progress Notes (Signed)
"   °      [facility] Counselor/Therapist Progress Note  Patient ID: Andrew Glover, MRN: [id number],    Date: 06/10/2024  Time Spent: 55 minutes   Treatment Type: Individual Therapy  Reported Symptoms: Anxiety, bipolar     Mental Status Exam:  Appearance:   Casual     Behavior:  Appropriate, Sharing, and Motivated  Motor:  Normal  Speech/Language:   Clear and Coherent  Affect:  Appropriate  Mood:  anxious  Thought process:  goal directed  Thought content:    Rumination  Sensory/Perceptual disturbances:    WNL  Orientation:  oriented to person, place, time/date, situation, day of week, month of year, year, and stated date of Dec. 30, 2025  Attention:  Good  Concentration:  Good  Memory:  WNL  Fund of knowledge:   Good  Insight:    Good and Fair  Judgment:   Good  Impulse Control:  Good and Fair   Risk Assessment: Danger to Self:  No Self-injurious Behavior: No Danger to Others: No Duty to Warn:no Physical Aggression / Violence:No  Access to Firearms a concern: No  Gang Involvement:No   Subjective:  Patient today in session to work further on his family/marital relationship issues. Very frustrated in certain family relationships. (Not all details included in this note due to patient privacy needs.)  Angry re: some financial issues and other family involvement. Patient venting concerns and frustrations relating to marriage and family (trust issues, communication difficult, issues with work and possible changes/eventual retirement, anger management ). Continue to encourage patient in more effective communication that includes careful listening and pausing to think more. Feeling unsupported in certain relationships. Working further on better managing stress, anger, and anxiety, and being able t not respond in negative ways that work against him. Continue to encourage patient in his making healthier decisions and choices.   Interventions: Cognitive Behavioral Therapy,  Solution-Oriented/Positive Psychology, and Ego-Supportive Reduce overall level, frequency, and intensity of the anxiety so that daily functioning is not impaired. Increase understanding of beliefs and messages that produce the worry and anxiety. Identify and use specific coping strategies for anxiety reduction what works versus does not work, and repeat the coping strategies that seem to be more effective.   Diagnosis:   ICD-10-CM   1. Bipolar I disorder (HCC)  F31.9       Plan:   Patient today working more in session on family conflict and issues, anxiety, personal issues, marital relationship, and communication within the family and beyond.  Still working to not overreact in some of his conversations and disagreements at home where communication is challenging.  Working on this in sessions here encouraging the avoidance of blame and focus rather on healthier communication.  Continue to encourage patient in managing stress and interactions with others and more positive ways.  Also encouraging him to remain more in the present versus the past or too far into the future, paying attention to his own anger and trying to manage it more effectively, staying in contact with supportive people, intentionally working to have more patience with others, and practicing healthy listening skills as noted in sessions to really focus on listening first and responding afterwards.  Goal review and progress/challenges noted with patient.  Next appointment within 3 weeks.   Barnie Bunde, LCSW                   "

## 2024-06-19 ENCOUNTER — Telehealth: Admitting: Adult Health

## 2024-06-19 DIAGNOSIS — F319 Bipolar disorder, unspecified: Secondary | ICD-10-CM

## 2024-06-19 DIAGNOSIS — G4700 Insomnia, unspecified: Secondary | ICD-10-CM | POA: Diagnosis not present

## 2024-06-19 DIAGNOSIS — F411 Generalized anxiety disorder: Secondary | ICD-10-CM

## 2024-06-19 NOTE — Progress Notes (Signed)
 Andrew Glover [id number] [birth year] 64 y.o.  Virtual Visit via Video Note  I connected with pt @ on 06/19/2024 at  5:30 PM EST by a video enabled telemedicine application and verified that I am speaking with the correct person using two identifiers.   I discussed the limitations of evaluation and management by telemedicine and the availability of in person appointments. The patient expressed understanding and agreed to proceed.  I discussed the assessment and treatment plan with the patient. The patient was provided an opportunity to ask questions and all were answered. The patient agreed with the plan and demonstrated an understanding of the instructions.   The patient was advised to call back or seek an in-person evaluation if the symptoms worsen or if the condition fails to improve as anticipated.  I provided 15 minutes of non-face-to-face time during this encounter.  The patient was located at home.  The provider was located at [facility].   Angeline LOISE Sayers, NP   Subjective:   Patient ID:  Andrew Glover is a 64 y.o. (DOB [birth year]) male.  Chief Complaint: No chief complaint on file.   HPI OLON RUSS presents for follow-up of BPD-1, GAD, and insomnia.  Describes mood today as ok. Pleasant. Denies tearfulness. Mood symptoms - denies depression, anxiety and irritability. Reports stable interest and motivation. Denies panic attacks. Denies over thinking. Denies obsessive thoughts and acts. Reports mood is stable. Stating I feel like I'm doing good. Taking medications as prescribed.  Energy levels stable. Active, does not have a regular exercise routine.  Enjoys some usual interests and activities. Married. Lives with wife - has 3 step children. Spending time with family and friends. Appetite adequate. Weight gain - 200+ pounds. Sleeps well most nights. Averages 6 hours most nights. Focus and concentration stable. Completing tasks. Managing aspects of household.  Works at [employer]. Denies SI or HI.  Denies AH or VH. Denies self harm. Denies substance use.  Previous medication trials: Abilify , Lamictal , Latuda, Lithium, Rexulti, Trileptal, Geodon   Review of Systems:  Review of Systems  Musculoskeletal:  Negative for gait problem.  Neurological:  Negative for tremors.  Psychiatric/Behavioral:         Please refer to HPI    Medications: I have reviewed the patient's current medications.  Current Outpatient Medications  Medication Sig Dispense Refill   ascorbic acid (VITAMIN C) 1000 MG tablet Take 1,000 mg by mouth daily.     aspirin 81 MG EC tablet Take 1 tablet by mouth every morning.     atenolol -chlorthalidone  (TENORETIC ) 50-25 MG tablet Take 1 tablet by mouth daily. 30 tablet 1   atorvastatin  (LIPITOR) 40 MG tablet Take 1 tablet (40 mg total) by mouth daily. Take 40 mg by mouth daily. 30 tablet 1   cetirizine (ZYRTEC) 10 MG tablet Take 10 mg by mouth daily.     clobetasol cream (TEMOVATE) 0.05 % Apply 1 Application topically as needed.     ergocalciferol (VITAMIN D2) 1.25 MG (50000 UT) capsule Take by mouth.     lamoTRIgine  (LAMICTAL ) 100 MG tablet Take 1 tablet (100 mg total) by mouth 2 (two) times daily. 180 tablet 1   losartan  (COZAAR ) 100 MG tablet Take 1 tablet (100 mg total) by mouth daily. 30 tablet 1   meloxicam (MOBIC) 15 MG tablet Take 15 mg by mouth.     metFORMIN (GLUCOPHAGE-XR) 500 MG 24 hr tablet Take 500 mg by mouth 2 (two) times daily.     methylPREDNISolone  (MEDROL ) 4  MG tablet Taper from 6 pills po for one day to 1 pill po the last day over 6 days 21 tablet 0   Multiple Vitamin (MULTIVITAMIN PO) Take 1 tablet by mouth daily.     Roflumilast  (ZORYVE ) 0.3 % CREA Apply 1 Application topically daily. 60 g 3   tacrolimus  (PROTOPIC ) 0.1 % ointment Apply topically 2 (two) times daily. Apply to groin twice daily 100 g 2   terbinafine  (LAMISIL ) 250 MG tablet Take 1 tablet (250 mg total) by mouth daily. Take 250 mg daily for one month  30 tablet 0   traZODone  (DESYREL ) 50 MG tablet Take 1 tablet (50 mg total) by mouth at bedtime. 30 tablet 2   triamcinolone  cream (KENALOG ) 0.1 % Apply 1 application topically 2 (two) times daily. (Patient taking differently: Apply 1 application  topically as needed.) 30 g 0   No current facility-administered medications for this visit.    Medication Side Effects: None  Allergies: Allergies[1]  Past Medical History:  Diagnosis Date   Bipolar 1 disorder (HCC)    Gout    Hyperlipidemia    Hypertension     Family History  Problem Relation Age of Onset   Stroke Mother    Rheumatic fever Mother    Heart Problems Mother    High blood pressure Father    High Cholesterol Father    Diabetes Father    Alzheimer's disease Father    Multiple myeloma Father     Social History   Socioeconomic History   Marital status: Married    Spouse name: Not on file   Number of children: Not on file   Years of education: Not on file   Highest education level: Not on file  Occupational History   Not on file  Tobacco Use   Smoking status: Former    Types: Cigarettes   Smokeless tobacco: Never  Vaping Use   Vaping status: Never Used  Substance and Sexual Activity   Alcohol use: Not Currently    Alcohol/week: 42.0 standard drinks of alcohol    Types: 42 Cans of beer per week   Drug use: Not Currently    Types: Marijuana   Sexual activity: Yes  Other Topics Concern   Not on file  Social History Narrative   Right handed   Caffeine use: 1 cup coffee per day   Social Drivers of Health   Tobacco Use: Medium Risk (06/10/2024)   Received from [facility]   Patient History    Smoking Tobacco Use: Former    Smokeless Tobacco Use: Former    Passive Exposure: Not on Actuary Strain: Not on file  Food Insecurity: Not on file  Transportation Needs: Not on file  Physical Activity: Not on file  Stress: Not on file (04/18/2023)  Social Connections: Not on file  Intimate  Partner Violence: Not on file  Depression (PHQ2-9): Low Risk (09/20/2021)   Depression (PHQ2-9)    PHQ-2 Score: 0  Alcohol Screen: Not on file  Housing: Not on file  Utilities: Not on file  Health Literacy: Not on file    Past Medical History, Surgical history, Social history, and Family history were reviewed and updated as appropriate.   Please see review of systems for further details on the patient's review from today.   Objective:   Physical Exam:  There were no vitals taken for this visit.  Physical Exam Constitutional:      General: He is not in acute distress. Musculoskeletal:  General: No deformity.  Neurological:     Mental Status: He is alert and oriented to person, place, and time.     Coordination: Coordination normal.  Psychiatric:        Attention and Perception: Attention and perception normal. He does not perceive auditory or visual hallucinations.        Mood and Affect: Mood normal. Mood is not anxious or depressed. Affect is not labile, blunt, angry or inappropriate.        Speech: Speech normal.        Behavior: Behavior normal.        Thought Content: Thought content normal. Thought content is not paranoid or delusional. Thought content does not include homicidal or suicidal ideation. Thought content does not include homicidal or suicidal plan.        Cognition and Memory: Cognition and memory normal.        Judgment: Judgment normal.     Comments: Insight intact     Lab Review:     Component Value Date/Time   NA 140 03/16/2023 2131   K 3.7 03/16/2023 2131   CL 104 03/16/2023 2131   CO2 29 03/16/2023 2131   GLUCOSE 144 (H) 03/16/2023 2131   BUN 19 03/16/2023 2131   CREATININE 0.94 03/16/2023 2131   CALCIUM  9.2 03/16/2023 2131   PROT 6.5 03/16/2023 2131   ALBUMIN 4.2 03/16/2023 2131   AST 18 03/16/2023 2131   ALT 21 03/16/2023 2131   ALKPHOS 78 03/16/2023 2131   BILITOT 0.5 03/16/2023 2131   GFRNONAA >60 03/16/2023 2131   GFRAA >90  03/20/2011 1955       Component Value Date/Time   WBC 8.3 03/16/2023 2131   RBC 5.07 03/16/2023 2131   HGB 15.8 03/16/2023 2131   HCT 44.2 03/16/2023 2131   PLT 187 03/16/2023 2131   MCV 87.2 03/16/2023 2131   MCH 31.2 03/16/2023 2131   MCHC 35.7 03/16/2023 2131   RDW 13.4 03/16/2023 2131   LYMPHSABS 2.2 03/16/2023 2131   MONOABS 0.6 03/16/2023 2131   EOSABS 0.2 03/16/2023 2131   BASOSABS 0.0 03/16/2023 2131    No results found for: POCLITH, LITHIUM   No results found for: PHENYTOIN, PHENOBARB, VALPROATE, CBMZ   .res Assessment: Plan:    Plan:  PDMP reviewed  Working with therapist - Marval Bunde  Continue: Lamictal  100mg  at hs and 100mg  in the morning.   Change: D/C Trazadone  RTC 4 weeks  15 minutes spent dedicated to the care of this patient on the date of this encounter to include pre-visit review of records, ordering of medication, post visit documentation, and face-to-face time with the patient discussing BPD-1, anxiety and insomnia. Discussed continuing current medication regimen.  Patient advised to contact office with any questions, adverse effects, or acute worsening in signs and symptoms.  Counseled patient regarding potential benefits, risks, and side effects of Lamictal  to include potential risk of Stevens-Johnson syndrome. Advised patient to stop taking Lamictal  and contact office immediately if rash develops and to seek urgent medical attention if rash is severe and/or spreading quickly.   There are no diagnoses linked to this encounter.   Please see After Visit Summary for patient specific instructions.  Andrew Appointments  Date Time Provider Department Center  06/19/2024  5:30 PM Eleaner Dibartolo, Angeline Mattocks, NP [facility] None  06/23/2024  5:00 PM Bunde Sober, LCSW [facility] None  07/07/2024  4:30 PM Claudene Lehmann, MD [facility] None  07/09/2024  5:00 PM Bunde Sober, LCSW [facility]  None  07/21/2024  5:00 PM Sherlynn Sober, LCSW [facility] None   08/06/2024  5:00 PM Sherlynn Sober, LCSW [facility] None  12/23/2024  3:45 PM Alm Delon SAILOR, DO [facility] None    No orders of the defined types were placed in this encounter.     -------------------------------      [1]  Allergies Allergen Reactions   Aripiprazole      Other Reaction(s): akathisias, weight gain on 5 mg daily (43 lbs over 6 yrs.Only 6 lbs and no akathisias over 2 years.   Brexpiprazole     Other Reaction(s): itchy ankles, not effective on 1.5 mg qd   Lamotrigine      Other Reaction(s): no SE, not effective   Lithium     Other Reaction(s): no SE, not effective   Lurasidone     Other Reaction(s): increased appetite, not as laid back:   Quetiapine     Other Reaction(s): sleepy, increased weight, not effective   Sulfa Antibiotics Hives and Itching   Ziprasidone      Other Reaction(s): drowsiness on 40 mg twice daily   Codeine Itching and Rash   Sulfamethoxazole Rash   Sulfamethoxazole-Trimethoprim Rash

## 2024-06-20 ENCOUNTER — Telehealth: Payer: Self-pay | Admitting: Gastroenterology

## 2024-06-20 ENCOUNTER — Encounter: Payer: Self-pay | Admitting: Adult Health

## 2024-06-20 NOTE — Telephone Encounter (Signed)
 Good morning Dr. San,    I received a call from this patient wife stating that her husband would like to transfer his GI care over to you. Patient had a colonoscopy with [facility]. Patient records are under medical ready for you to review. Would you please advise on scheduling this patient.    Thank you.

## 2024-06-23 ENCOUNTER — Ambulatory Visit: Admitting: Psychiatry

## 2024-06-23 DIAGNOSIS — F319 Bipolar disorder, unspecified: Secondary | ICD-10-CM

## 2024-06-23 NOTE — Progress Notes (Signed)
 "       [facility] Counselor/Therapist Progress Note  Patient ID: CANDACE RAMUS, MRN: [id number],    Date: 06/23/2024  Time Spent: 53 minutes   Treatment Type: Individual Therapy  Reported Symptoms:    Anxiety, bipolar  Mental Status Exam:  Appearance:   Casual and Neat     Behavior:  Appropriate, Sharing, and Motivated  Motor:  Normal  Speech/Language:   Clear and Coherent  Affect:  anxious  Mood:  anxious  Thought process:  goal directed  Thought content:    WNL  Sensory/Perceptual disturbances:    WNL  Orientation:  oriented to person, place, time/date, situation, day of week, month of year, year, and stated date of Jan. 12, 2026  Attention:  Good  Concentration:  Good  Memory:  WNL  Fund of knowledge:   Good  Insight:    Good and Fair  Judgment:   Good  Impulse Control:  Good   Risk Assessment: Danger to Self:  No Self-injurious Behavior: No Danger to Others: No Duty to Warn:no Physical Aggression / Violence:No  Access to Firearms a concern: No  Gang Involvement:No    Subjective: Patient focusing in session today on some continued work regarding family/marital relationship issues. Having arm issue recently and to see Dr. Bonner later this week, after having MRI done. Still having some anger issues and flying off at home but states he and wife are actually getting along some better (in some ways) for the past couple weeks. Processing other family concerns including younger grandson and shares he is working to get a veterinary surgeon for him, as grandson had asked him about getting help. Patient sharing and talking through more stress including extended family and at work. Trying improve his stress management, listening to others and pausing to think things through before speaking or acting which is a habit patient is needing to develop more. Also encouraged in his better management of making choices, and also anger, anxiety, and stressful  situations/relationships.   Interventions: Cognitive Behavioral Therapy, Solution-Oriented/Positive Psychology, and Ego-Supportive Reduce overall level, frequency, and intensity of the anxiety so that daily functioning is not impaired. Increase understanding of beliefs and messages that produce the worry and anxiety. Identify and use specific coping strategies for anxiety reduction what works versus does not work, and repeat the coping strategies that seem to be more effective.   Diagnosis:   ICD-10-CM   1. Bipolar I disorder (HCC)  F31.9      Plan:    Patient today working more on some self-discipline and being able to respect and follow rules in some situations.  Also working further on issues revolving around family anxiety, some conflict although it has reportedly decreased, personal issues, marital relationship which can be volatile at times, and communication/relationships within extended family.  Trying to improve on not overreacting in certain situations.  Also trying to improve his communication in those challenging situations which can lead to feeling better heard and also decrease negative outcomes.  Trying to stay more in the present rather than the past or too far into the future, being more aware and paying attention to his own anger and be able to manage it more effectively, staying in contact with supportive people, practicing healthy listening skills as noted in sessions to really focus on listening first and responding later, having more patience with other people, and having an appreciation for all that he is trying to work on and change to be more effective in his relationships  with other people, being a healthy role model for children involved within the family, and feel better about himself.  Goal review and progress/challenges noted with patient.  Next appointment within 3 weeks.   Barnie Bunde, LCSW                   "

## 2024-07-03 ENCOUNTER — Other Ambulatory Visit: Payer: Self-pay | Admitting: Adult Health

## 2024-07-03 DIAGNOSIS — G4700 Insomnia, unspecified: Secondary | ICD-10-CM

## 2024-07-07 ENCOUNTER — Ambulatory Visit: Admitting: Dermatology

## 2024-07-09 ENCOUNTER — Ambulatory Visit: Admitting: Psychiatry

## 2024-07-15 ENCOUNTER — Encounter: Payer: Self-pay | Admitting: Dermatology

## 2024-07-15 ENCOUNTER — Ambulatory Visit: Admitting: Dermatology

## 2024-07-15 DIAGNOSIS — R208 Other disturbances of skin sensation: Secondary | ICD-10-CM

## 2024-07-15 DIAGNOSIS — L299 Pruritus, unspecified: Secondary | ICD-10-CM

## 2024-07-15 DIAGNOSIS — Z7189 Other specified counseling: Secondary | ICD-10-CM

## 2024-07-15 DIAGNOSIS — B368 Other specified superficial mycoses: Secondary | ICD-10-CM

## 2024-07-15 DIAGNOSIS — L249 Irritant contact dermatitis, unspecified cause: Secondary | ICD-10-CM

## 2024-07-15 MED ORDER — TERBINAFINE HCL 250 MG PO TABS: ORAL_TABLET | ORAL | 0 refills | Status: DC

## 2024-07-15 MED ORDER — OPZELURA 1.5 % EX CREA: TOPICAL_CREAM | CUTANEOUS | 5 refills | Status: AC

## 2024-07-15 MED ORDER — TERBINAFINE HCL 250 MG PO TABS: ORAL_TABLET | ORAL | 0 refills | Status: AC

## 2024-07-15 NOTE — Patient Instructions (Signed)

## 2024-07-17 NOTE — Telephone Encounter (Signed)
 Good morning Dr. San,     We have received patient colonoscopy report and pathology report.Patient records are under media tab in EPIC labeled as [facility] Gl[facility]. Please advise.    Thank you.

## 2024-07-21 ENCOUNTER — Ambulatory Visit: Admitting: Psychiatry

## 2024-08-06 ENCOUNTER — Ambulatory Visit: Admitting: Psychiatry

## 2024-08-18 ENCOUNTER — Ambulatory Visit: Admitting: Dermatology

## 2024-08-19 ENCOUNTER — Ambulatory Visit: Admitting: Psychiatry

## 2024-09-02 ENCOUNTER — Ambulatory Visit: Admitting: Psychiatry

## 2024-09-16 ENCOUNTER — Ambulatory Visit: Admitting: Psychiatry

## 2024-12-23 ENCOUNTER — Ambulatory Visit: Admitting: Dermatology
